# Patient Record
Sex: Male | Born: 1950 | Smoking: Former smoker
Health system: Southern US, Community
[De-identification: ages and names within clinical notes are randomized; demographics above are authoritative.]

---

## 2018-04-04 ENCOUNTER — Inpatient Hospital Stay (HOSPITAL_COMMUNITY): Payer: Medicare (Managed Care)

## 2018-04-04 ENCOUNTER — Inpatient Hospital Stay (HOSPITAL_COMMUNITY)
Admission: AD | Admit: 2018-04-04 | Discharge: 2018-04-15 | DRG: 870 | Disposition: E | Payer: Medicare (Managed Care) | Source: Other Acute Inpatient Hospital | Attending: Pulmonary Disease | Admitting: Pulmonary Disease

## 2018-04-04 DIAGNOSIS — Z7189 Other specified counseling: Secondary | ICD-10-CM

## 2018-04-04 DIAGNOSIS — I619 Nontraumatic intracerebral hemorrhage, unspecified: Secondary | ICD-10-CM | POA: Diagnosis not present

## 2018-04-04 DIAGNOSIS — N179 Acute kidney failure, unspecified: Secondary | ICD-10-CM

## 2018-04-04 DIAGNOSIS — Z87891 Personal history of nicotine dependence: Secondary | ICD-10-CM

## 2018-04-04 DIAGNOSIS — I130 Hypertensive heart and chronic kidney disease with heart failure and stage 1 through stage 4 chronic kidney disease, or unspecified chronic kidney disease: Secondary | ICD-10-CM | POA: Diagnosis present

## 2018-04-04 DIAGNOSIS — A419 Sepsis, unspecified organism: Principal | ICD-10-CM | POA: Diagnosis present

## 2018-04-04 DIAGNOSIS — Z66 Do not resuscitate: Secondary | ICD-10-CM | POA: Diagnosis present

## 2018-04-04 DIAGNOSIS — D500 Iron deficiency anemia secondary to blood loss (chronic): Secondary | ICD-10-CM

## 2018-04-04 DIAGNOSIS — I6310 Cerebral infarction due to embolism of unspecified precerebral artery: Secondary | ICD-10-CM | POA: Diagnosis not present

## 2018-04-04 DIAGNOSIS — F0390 Unspecified dementia without behavioral disturbance: Secondary | ICD-10-CM | POA: Diagnosis present

## 2018-04-04 DIAGNOSIS — E876 Hypokalemia: Secondary | ICD-10-CM | POA: Diagnosis not present

## 2018-04-04 DIAGNOSIS — I214 Non-ST elevation (NSTEMI) myocardial infarction: Secondary | ICD-10-CM | POA: Diagnosis present

## 2018-04-04 DIAGNOSIS — I63413 Cerebral infarction due to embolism of bilateral middle cerebral arteries: Secondary | ICD-10-CM | POA: Diagnosis not present

## 2018-04-04 DIAGNOSIS — G9340 Encephalopathy, unspecified: Secondary | ICD-10-CM | POA: Diagnosis present

## 2018-04-04 DIAGNOSIS — Z452 Encounter for adjustment and management of vascular access device: Secondary | ICD-10-CM

## 2018-04-04 DIAGNOSIS — Z8249 Family history of ischemic heart disease and other diseases of the circulatory system: Secondary | ICD-10-CM

## 2018-04-04 DIAGNOSIS — J150 Pneumonia due to Klebsiella pneumoniae: Secondary | ICD-10-CM | POA: Diagnosis present

## 2018-04-04 DIAGNOSIS — J9601 Acute respiratory failure with hypoxia: Secondary | ICD-10-CM | POA: Diagnosis not present

## 2018-04-04 DIAGNOSIS — E440 Moderate protein-calorie malnutrition: Secondary | ICD-10-CM | POA: Diagnosis not present

## 2018-04-04 DIAGNOSIS — E1122 Type 2 diabetes mellitus with diabetic chronic kidney disease: Secondary | ICD-10-CM | POA: Diagnosis present

## 2018-04-04 DIAGNOSIS — K219 Gastro-esophageal reflux disease without esophagitis: Secondary | ICD-10-CM | POA: Diagnosis present

## 2018-04-04 DIAGNOSIS — I6340 Cerebral infarction due to embolism of unspecified cerebral artery: Secondary | ICD-10-CM

## 2018-04-04 DIAGNOSIS — E874 Mixed disorder of acid-base balance: Secondary | ICD-10-CM | POA: Diagnosis present

## 2018-04-04 DIAGNOSIS — J9600 Acute respiratory failure, unspecified whether with hypoxia or hypercapnia: Secondary | ICD-10-CM | POA: Diagnosis not present

## 2018-04-04 DIAGNOSIS — N170 Acute kidney failure with tubular necrosis: Secondary | ICD-10-CM | POA: Diagnosis present

## 2018-04-04 DIAGNOSIS — R680 Hypothermia, not associated with low environmental temperature: Secondary | ICD-10-CM | POA: Diagnosis present

## 2018-04-04 DIAGNOSIS — I5032 Chronic diastolic (congestive) heart failure: Secondary | ICD-10-CM | POA: Diagnosis not present

## 2018-04-04 DIAGNOSIS — R7989 Other specified abnormal findings of blood chemistry: Secondary | ICD-10-CM | POA: Diagnosis not present

## 2018-04-04 DIAGNOSIS — J9690 Respiratory failure, unspecified, unspecified whether with hypoxia or hypercapnia: Secondary | ICD-10-CM

## 2018-04-04 DIAGNOSIS — K7200 Acute and subacute hepatic failure without coma: Secondary | ICD-10-CM | POA: Diagnosis present

## 2018-04-04 DIAGNOSIS — G935 Compression of brain: Secondary | ICD-10-CM | POA: Diagnosis present

## 2018-04-04 DIAGNOSIS — K922 Gastrointestinal hemorrhage, unspecified: Secondary | ICD-10-CM | POA: Diagnosis not present

## 2018-04-04 DIAGNOSIS — Z794 Long term (current) use of insulin: Secondary | ICD-10-CM

## 2018-04-04 DIAGNOSIS — M810 Age-related osteoporosis without current pathological fracture: Secondary | ICD-10-CM | POA: Diagnosis present

## 2018-04-04 DIAGNOSIS — E875 Hyperkalemia: Secondary | ICD-10-CM | POA: Diagnosis present

## 2018-04-04 DIAGNOSIS — E1165 Type 2 diabetes mellitus with hyperglycemia: Secondary | ICD-10-CM | POA: Diagnosis not present

## 2018-04-04 DIAGNOSIS — K921 Melena: Secondary | ICD-10-CM | POA: Diagnosis not present

## 2018-04-04 DIAGNOSIS — R945 Abnormal results of liver function studies: Secondary | ICD-10-CM

## 2018-04-04 DIAGNOSIS — N183 Chronic kidney disease, stage 3 (moderate): Secondary | ICD-10-CM | POA: Diagnosis present

## 2018-04-04 DIAGNOSIS — L89811 Pressure ulcer of head, stage 1: Secondary | ICD-10-CM | POA: Diagnosis not present

## 2018-04-04 DIAGNOSIS — E785 Hyperlipidemia, unspecified: Secondary | ICD-10-CM | POA: Diagnosis present

## 2018-04-04 DIAGNOSIS — Z833 Family history of diabetes mellitus: Secondary | ICD-10-CM

## 2018-04-04 DIAGNOSIS — Z515 Encounter for palliative care: Secondary | ICD-10-CM

## 2018-04-04 DIAGNOSIS — D72829 Elevated white blood cell count, unspecified: Secondary | ICD-10-CM | POA: Diagnosis not present

## 2018-04-04 DIAGNOSIS — Z1612 Extended spectrum beta lactamase (ESBL) resistance: Secondary | ICD-10-CM | POA: Diagnosis not present

## 2018-04-04 DIAGNOSIS — Z6825 Body mass index (BMI) 25.0-25.9, adult: Secondary | ICD-10-CM

## 2018-04-04 DIAGNOSIS — R509 Fever, unspecified: Secondary | ICD-10-CM | POA: Diagnosis not present

## 2018-04-04 DIAGNOSIS — Z79899 Other long term (current) drug therapy: Secondary | ICD-10-CM

## 2018-04-04 DIAGNOSIS — R6521 Severe sepsis with septic shock: Secondary | ICD-10-CM | POA: Diagnosis present

## 2018-04-04 DIAGNOSIS — Z7982 Long term (current) use of aspirin: Secondary | ICD-10-CM

## 2018-04-04 DIAGNOSIS — D696 Thrombocytopenia, unspecified: Secondary | ICD-10-CM | POA: Diagnosis not present

## 2018-04-04 DIAGNOSIS — L8990 Pressure ulcer of unspecified site, unspecified stage: Secondary | ICD-10-CM

## 2018-04-04 DIAGNOSIS — I959 Hypotension, unspecified: Secondary | ICD-10-CM | POA: Diagnosis not present

## 2018-04-04 DIAGNOSIS — Z8673 Personal history of transient ischemic attack (TIA), and cerebral infarction without residual deficits: Secondary | ICD-10-CM

## 2018-04-04 DIAGNOSIS — Z4659 Encounter for fitting and adjustment of other gastrointestinal appliance and device: Secondary | ICD-10-CM

## 2018-04-04 DIAGNOSIS — I82409 Acute embolism and thrombosis of unspecified deep veins of unspecified lower extremity: Secondary | ICD-10-CM | POA: Diagnosis not present

## 2018-04-04 LAB — POCT I-STAT 3, ART BLOOD GAS (G3+)
Acid-base deficit: 29 mmol/L — ABNORMAL HIGH (ref 0.0–2.0)
Acid-base deficit: 25 mmol/L — ABNORMAL HIGH (ref 0.0–2.0)
Acid-base deficit: 22 mmol/L — ABNORMAL HIGH (ref 0.0–2.0)
BICARBONATE: 4 mmol/L — AB (ref 20.0–28.0)
Bicarbonate: 5.1 mmol/L — ABNORMAL LOW (ref 20.0–28.0)
Bicarbonate: 8.7 mmol/L — ABNORMAL LOW (ref 20.0–28.0)
O2 Saturation: 98 %
O2 Saturation: 98 %
O2 Saturation: 98 %
PCO2 ART: 20.3 mmHg — AB (ref 32.0–48.0)
PO2 ART: 176 mmHg — AB (ref 83.0–108.0)
Patient temperature: 34.1
Patient temperature: 33.8
TCO2: <5 mmol/L — ABNORMAL LOW (ref 22–32)
TCO2: 6 mmol/L — ABNORMAL LOW (ref 22–32)
TCO2: 10 mmol/L — ABNORMAL LOW (ref 22–32)
pCO2 arterial: 20.4 mmHg — ABNORMAL LOW (ref 32.0–48.0)
pCO2 arterial: 32.2 mmHg (ref 32.0–48.0)
pH, Arterial: 6.875 — CL (ref 7.350–7.450)
pH, Arterial: 6.985 — CL (ref 7.350–7.450)
pH, Arterial: 7.017 — CL (ref 7.350–7.450)
pO2, Arterial: 156 mmHg — ABNORMAL HIGH (ref 83.0–108.0)
pO2, Arterial: 138 mmHg — ABNORMAL HIGH (ref 83.0–108.0)

## 2018-04-04 LAB — CBC WITH DIFFERENTIAL/PLATELET
Band Neutrophils: 0 %
Basophils Absolute: 0.3 10*3/uL — ABNORMAL HIGH (ref 0.0–0.1)
Basophils Relative: 1 %
Blasts: 0 %
Eosinophils Absolute: 0 10*3/uL (ref 0.0–0.5)
Eosinophils Relative: 0 %
HCT: 39.2 % (ref 39.0–52.0)
Hemoglobin: 11.2 g/dL — ABNORMAL LOW (ref 13.0–17.0)
Lymphocytes Relative: 10 %
Lymphs Abs: 2.8 10*3/uL (ref 0.7–4.0)
MCH: 32.6 pg (ref 26.0–34.0)
MCHC: 28.6 g/dL — ABNORMAL LOW (ref 30.0–36.0)
MCV: 114 fL — ABNORMAL HIGH (ref 80.0–100.0)
MONO ABS: 3 10*3/uL — AB (ref 0.1–1.0)
Metamyelocytes Relative: 0 %
Monocytes Relative: 11 %
Myelocytes: 0 %
Neutro Abs: 21.5 10*3/uL — ABNORMAL HIGH (ref 1.7–7.7)
Neutrophils Relative %: 78 %
Other: 0 %
PLATELETS: 273 10*3/uL (ref 150–400)
Promyelocytes Relative: 0 %
RBC: 3.44 MIL/uL — ABNORMAL LOW (ref 4.22–5.81)
RDW: 13.2 % (ref 11.5–15.5)
WBC: 27.6 10*3/uL — ABNORMAL HIGH (ref 4.0–10.5)
nRBC: 0 % (ref 0.0–0.2)
nRBC: 0 /100 WBC

## 2018-04-04 LAB — TROPONIN I
TROPONIN I: 1.06 ng/mL — AB (ref <0.03)
TROPONIN I: 9.41 ng/mL — AB (ref <0.03)
Troponin I: 1.5 ng/mL (ref <0.03)
Troponin I: 4.78 ng/mL (ref <0.03)

## 2018-04-04 LAB — COMPREHENSIVE METABOLIC PANEL
ALT: 123 U/L — ABNORMAL HIGH (ref 0–44)
ALT: 118 U/L — ABNORMAL HIGH (ref 0–44)
AST: 258 U/L — ABNORMAL HIGH (ref 15–41)
AST: 232 U/L — ABNORMAL HIGH (ref 15–41)
Albumin: 2.2 g/dL — ABNORMAL LOW (ref 3.5–5.0)
Albumin: 2.2 g/dL — ABNORMAL LOW (ref 3.5–5.0)
Alkaline Phosphatase: 129 U/L — ABNORMAL HIGH (ref 38–126)
Alkaline Phosphatase: 124 U/L (ref 38–126)
Anion gap: 33 — ABNORMAL HIGH (ref 5–15)
BUN: 103 mg/dL — ABNORMAL HIGH (ref 8–23)
BUN: 88 mg/dL — ABNORMAL HIGH (ref 8–23)
CO2: <7 mmol/L — ABNORMAL LOW (ref 22–32)
CO2: 10 mmol/L — ABNORMAL LOW (ref 22–32)
Calcium: 7.6 mg/dL — ABNORMAL LOW (ref 8.9–10.3)
Calcium: 6.5 mg/dL — ABNORMAL LOW (ref 8.9–10.3)
Chloride: 108 mmol/L (ref 98–111)
Chloride: 102 mmol/L (ref 98–111)
Creatinine, Ser: 7.68 mg/dL — ABNORMAL HIGH (ref 0.61–1.24)
Creatinine, Ser: 6.22 mg/dL — ABNORMAL HIGH (ref 0.61–1.24)
GFR calc Af Amer: 10 mL/min — ABNORMAL LOW (ref >60)
GFR calc non Af Amer: 8 mL/min — ABNORMAL LOW (ref >60)
GFR, EST AFRICAN AMERICAN: 8 mL/min — AB (ref >60)
GFR, EST NON AFRICAN AMERICAN: 7 mL/min — AB (ref >60)
Glucose, Bld: 252 mg/dL — ABNORMAL HIGH (ref 70–99)
Glucose, Bld: 280 mg/dL — ABNORMAL HIGH (ref 70–99)
Potassium: 7.3 mmol/L (ref 3.5–5.1)
Potassium: 6.1 mmol/L — ABNORMAL HIGH (ref 3.5–5.1)
Sodium: 144 mmol/L (ref 135–145)
Sodium: 145 mmol/L (ref 135–145)
TOTAL PROTEIN: 4.8 g/dL — AB (ref 6.5–8.1)
Total Bilirubin: 1.8 mg/dL — ABNORMAL HIGH (ref 0.3–1.2)
Total Bilirubin: 2.3 mg/dL — ABNORMAL HIGH (ref 0.3–1.2)
Total Protein: 4.4 g/dL — ABNORMAL LOW (ref 6.5–8.1)

## 2018-04-04 LAB — VOLATILES,BLD-ACETONE,ETHANOL,ISOPROP,METHANOL
Acetone, blood: 0.016 % — ABNORMAL HIGH (ref 0.000–0.010)
Ethanol, blood: NEGATIVE % (ref 0.000–0.010)
Isopropanol, blood: NEGATIVE % (ref 0.000–0.010)
Methanol, blood: NEGATIVE % (ref 0.000–0.010)

## 2018-04-04 LAB — RENAL FUNCTION PANEL
Albumin: 2.1 g/dL — ABNORMAL LOW (ref 3.5–5.0)
Anion gap: 30 — ABNORMAL HIGH (ref 5–15)
BUN: 69 mg/dL — ABNORMAL HIGH (ref 8–23)
CO2: 16 mmol/L — ABNORMAL LOW (ref 22–32)
Calcium: 6 mg/dL — CL (ref 8.9–10.3)
Chloride: 96 mmol/L — ABNORMAL LOW (ref 98–111)
Creatinine, Ser: 4.9 mg/dL — ABNORMAL HIGH (ref 0.61–1.24)
GFR calc Af Amer: 13 mL/min — ABNORMAL LOW (ref >60)
GFR calc non Af Amer: 11 mL/min — ABNORMAL LOW (ref >60)
Glucose, Bld: 124 mg/dL — ABNORMAL HIGH (ref 70–99)
Phosphorus: 5.3 mg/dL — ABNORMAL HIGH (ref 2.5–4.6)
Potassium: 4.9 mmol/L (ref 3.5–5.1)
Sodium: 142 mmol/L (ref 135–145)

## 2018-04-04 LAB — POCT I-STAT, CHEM 8
BUN: 81 mg/dL — ABNORMAL HIGH (ref 8–23)
CHLORIDE: 105 mmol/L (ref 98–111)
CREATININE: 5.4 mg/dL — AB (ref 0.61–1.24)
Calcium, Ion: 0.79 mmol/L — CL (ref 1.15–1.40)
Glucose, Bld: 220 mg/dL — ABNORMAL HIGH (ref 70–99)
HCT: 30 % — ABNORMAL LOW (ref 39.0–52.0)
Hemoglobin: 10.2 g/dL — ABNORMAL LOW (ref 13.0–17.0)
Potassium: 5.5 mmol/L — ABNORMAL HIGH (ref 3.5–5.1)
Sodium: 141 mmol/L (ref 135–145)
TCO2: 13 mmol/L — ABNORMAL LOW (ref 22–32)

## 2018-04-04 LAB — LACTIC ACID, PLASMA
LACTIC ACID, VENOUS: 11.4 mmol/L — AB (ref 0.5–1.9)
Lactic Acid, Venous: 12.1 mmol/L (ref 0.5–1.9)
Lactic Acid, Venous: 9 mmol/L (ref 0.5–1.9)
Lactic Acid, Venous: 7.6 mmol/L (ref 0.5–1.9)

## 2018-04-04 LAB — POCT ACTIVATED CLOTTING TIME
ACTIVATED CLOTTING TIME: 158 s
Activated Clotting Time: 147 seconds
Activated Clotting Time: 175 seconds
Activated Clotting Time: 175 seconds
Activated Clotting Time: 219 seconds
Activated Clotting Time: 208 seconds
Activated Clotting Time: 208 seconds
Activated Clotting Time: 213 seconds
Activated Clotting Time: 219 seconds
Activated Clotting Time: 235 seconds
Activated Clotting Time: 246 seconds
Activated Clotting Time: 252 seconds
Activated Clotting Time: 257 seconds
Activated Clotting Time: 268 seconds
Activated Clotting Time: 246 seconds

## 2018-04-04 LAB — CBC
HCT: 38.8 % — ABNORMAL LOW (ref 39.0–52.0)
HCT: 34.6 % — ABNORMAL LOW (ref 39.0–52.0)
Hemoglobin: 11.3 g/dL — ABNORMAL LOW (ref 13.0–17.0)
Hemoglobin: 10.7 g/dL — ABNORMAL LOW (ref 13.0–17.0)
MCH: 32.6 pg (ref 26.0–34.0)
MCH: 32.2 pg (ref 26.0–34.0)
MCHC: 29.1 g/dL — ABNORMAL LOW (ref 30.0–36.0)
MCHC: 30.9 g/dL (ref 30.0–36.0)
MCV: 111.8 fL — ABNORMAL HIGH (ref 80.0–100.0)
MCV: 104.2 fL — ABNORMAL HIGH (ref 80.0–100.0)
Platelets: 198 10*3/uL (ref 150–400)
Platelets: 160 10*3/uL (ref 150–400)
RBC: 3.47 MIL/uL — AB (ref 4.22–5.81)
RBC: 3.32 MIL/uL — AB (ref 4.22–5.81)
RDW: 13.1 % (ref 11.5–15.5)
RDW: 13.2 % (ref 11.5–15.5)
WBC: 21 10*3/uL — ABNORMAL HIGH (ref 4.0–10.5)
WBC: 25.7 10*3/uL — ABNORMAL HIGH (ref 4.0–10.5)
nRBC: 0.1 % (ref 0.0–0.2)
nRBC: 0 % (ref 0.0–0.2)

## 2018-04-04 LAB — URINALYSIS, ROUTINE W REFLEX MICROSCOPIC
GLUCOSE, UA: NEGATIVE mg/dL
Ketones, ur: 15 mg/dL — AB
Leukocytes, UA: NEGATIVE
Nitrite: NEGATIVE
Protein, ur: >300 mg/dL — AB
Specific Gravity, Urine: >1.03 — ABNORMAL HIGH (ref 1.005–1.030)
pH: 5 (ref 5.0–8.0)

## 2018-04-04 LAB — PHOSPHORUS
Phosphorus: 13.6 mg/dL — ABNORMAL HIGH (ref 2.5–4.6)
Phosphorus: 12.8 mg/dL — ABNORMAL HIGH (ref 2.5–4.6)
Phosphorus: 11.2 mg/dL — ABNORMAL HIGH (ref 2.5–4.6)

## 2018-04-04 LAB — PROTIME-INR
INR: 1.48
INR: 1.4
Prothrombin Time: 17.7 seconds — ABNORMAL HIGH (ref 11.4–15.2)
Prothrombin Time: 17 seconds — ABNORMAL HIGH (ref 11.4–15.2)

## 2018-04-04 LAB — BASIC METABOLIC PANEL
BUN: 98 mg/dL — ABNORMAL HIGH (ref 8–23)
CO2: <7 mmol/L — ABNORMAL LOW (ref 22–32)
Calcium: 7.2 mg/dL — ABNORMAL LOW (ref 8.9–10.3)
Chloride: 108 mmol/L (ref 98–111)
Creatinine, Ser: 7.15 mg/dL — ABNORMAL HIGH (ref 0.61–1.24)
GFR calc Af Amer: 8 mL/min — ABNORMAL LOW (ref >60)
GFR calc non Af Amer: 7 mL/min — ABNORMAL LOW (ref >60)
GLUCOSE: 268 mg/dL — AB (ref 70–99)
Potassium: 7 mmol/L (ref 3.5–5.1)
Sodium: 147 mmol/L — ABNORMAL HIGH (ref 135–145)

## 2018-04-04 LAB — HEPARIN LEVEL (UNFRACTIONATED): Heparin Unfractionated: 1.68 IU/mL — ABNORMAL HIGH (ref 0.30–0.70)

## 2018-04-04 LAB — GLUCOSE, CAPILLARY
GLUCOSE-CAPILLARY: 247 mg/dL — AB (ref 70–99)
Glucose-Capillary: 216 mg/dL — ABNORMAL HIGH (ref 70–99)
Glucose-Capillary: 222 mg/dL — ABNORMAL HIGH (ref 70–99)
Glucose-Capillary: 217 mg/dL — ABNORMAL HIGH (ref 70–99)
Glucose-Capillary: 161 mg/dL — ABNORMAL HIGH (ref 70–99)
Glucose-Capillary: 110 mg/dL — ABNORMAL HIGH (ref 70–99)
Glucose-Capillary: 124 mg/dL — ABNORMAL HIGH (ref 70–99)

## 2018-04-04 LAB — APTT
aPTT: 29 seconds (ref 24–36)
aPTT: 64 seconds — ABNORMAL HIGH (ref 24–36)

## 2018-04-04 LAB — MAGNESIUM
Magnesium: 2.2 mg/dL (ref 1.7–2.4)
Magnesium: 2.1 mg/dL (ref 1.7–2.4)

## 2018-04-04 LAB — ABO/RH: ABO/RH(D): O POS

## 2018-04-04 LAB — CORTISOL: Cortisol, Plasma: >100 ug/dL

## 2018-04-04 LAB — URINALYSIS, MICROSCOPIC (REFLEX): RBC / HPF: >50 RBC/hpf (ref 0–5)

## 2018-04-04 LAB — COOXEMETRY PANEL
CARBOXYHEMOGLOBIN: 1 % (ref 0.5–1.5)
Methemoglobin: 2.2 % — ABNORMAL HIGH (ref 0.0–1.5)
O2 Saturation: 85 %
Total hemoglobin: 6.2 g/dL — CL (ref 12.0–16.0)

## 2018-04-04 LAB — AMMONIA
Ammonia: 63 umol/L — ABNORMAL HIGH (ref 9–35)
Ammonia: 29 umol/L (ref 9–35)

## 2018-04-04 LAB — BETA-HYDROXYBUTYRIC ACID: Beta-Hydroxybutyric Acid: 7.38 mmol/L — ABNORMAL HIGH (ref 0.05–0.27)

## 2018-04-04 LAB — MRSA PCR SCREENING: MRSA by PCR: NEGATIVE

## 2018-04-04 MED ORDER — SODIUM BICARBONATE 8.4 % IV SOLN
INTRAVENOUS | Status: AC
  Administered 2018-04-04: 50 meq
  Filled 2018-04-04: qty 50

## 2018-04-04 MED ORDER — BISACODYL 10 MG RE SUPP: 10.0000 mg | Freq: Every day | RECTAL | Status: DC | PRN

## 2018-04-04 MED ORDER — NOREPINEPHRINE 16 MG/250ML-% IV SOLN
0.0000 ug/min | INTRAVENOUS | Status: DC
  Administered 2018-04-04: 40 ug/min via INTRAVENOUS
  Administered 2018-04-04: 55 ug/min via INTRAVENOUS
  Administered 2018-04-04: 40 ug/min via INTRAVENOUS
  Administered 2018-04-05: 26 ug/min via INTRAVENOUS
  Filled 2018-04-04 (×5): qty 250

## 2018-04-04 MED ORDER — CHLORHEXIDINE GLUCONATE 0.12% ORAL RINSE (MEDLINE KIT)
15.0000 mL | Freq: Two times a day (BID) | OROMUCOSAL | Status: DC
  Administered 2018-04-04 – 2018-04-13 (×18): 15 mL via OROMUCOSAL

## 2018-04-04 MED ORDER — HEPARIN SODIUM (PORCINE) 1000 UNIT/ML DIALYSIS: 1000.0000 [IU] | INTRAMUSCULAR | Status: DC | PRN

## 2018-04-04 MED ORDER — SODIUM CHLORIDE 0.9 % IV SOLN
250.0000 [IU]/h | INTRAVENOUS | Status: DC
  Administered 2018-04-04: 700 [IU]/h via INTRAVENOUS_CENTRAL
  Administered 2018-04-04: 500 [IU]/h via INTRAVENOUS_CENTRAL
  Administered 2018-04-04: 800 [IU]/h via INTRAVENOUS_CENTRAL
  Administered 2018-04-04: 900 [IU]/h via INTRAVENOUS_CENTRAL
  Administered 2018-04-05: 250 [IU]/h via INTRAVENOUS_CENTRAL
  Administered 2018-04-06: 1000 [IU]/h via INTRAVENOUS_CENTRAL
  Administered 2018-04-06: 850 [IU]/h via INTRAVENOUS_CENTRAL
  Administered 2018-04-06: 1000 [IU]/h via INTRAVENOUS_CENTRAL
  Administered 2018-04-06: 850 [IU]/h via INTRAVENOUS_CENTRAL
  Administered 2018-04-07 – 2018-04-08 (×5): 1000 [IU]/h via INTRAVENOUS_CENTRAL
  Filled 2018-04-04 (×16): qty 2

## 2018-04-04 MED ORDER — FAMOTIDINE 20 MG PO TABS
20.0000 mg | ORAL_TABLET | Freq: Two times a day (BID) | ORAL | Status: DC
  Administered 2018-04-04 (×2): 20 mg
  Filled 2018-04-04 (×2): qty 1

## 2018-04-04 MED ORDER — SODIUM BICARBONATE 8.4 % IV SOLN
100.0000 meq | Freq: Once | INTRAVENOUS | Status: AC
  Administered 2018-04-04: 100 meq via INTRAVENOUS
  Filled 2018-04-04: qty 100

## 2018-04-04 MED ORDER — FENTANYL CITRATE (PF) 100 MCG/2ML IJ SOLN
50.0000 ug | INTRAMUSCULAR | Status: DC | PRN
  Administered 2018-04-04 – 2018-04-05 (×2): 50 ug via INTRAVENOUS
  Filled 2018-04-04: qty 2

## 2018-04-04 MED ORDER — NOREPINEPHRINE BITARTRATE 1 MG/ML IV SOLN
5.0000 ug/min | INTRAVENOUS | Status: DC
  Filled 2018-04-04: qty 4

## 2018-04-04 MED ORDER — STERILE WATER FOR INJECTION IV SOLN
INTRAVENOUS | Status: DC
  Administered 2018-04-04 – 2018-04-05 (×9): via INTRAVENOUS_CENTRAL
  Filled 2018-04-04 (×14): qty 150

## 2018-04-04 MED ORDER — HEPARIN (PORCINE) 2000 UNITS/L FOR CRRT
INTRAVENOUS_CENTRAL | Status: DC | PRN
  Administered 2018-04-04: 05:00:00 via INTRAVENOUS_CENTRAL

## 2018-04-04 MED ORDER — GENERIC EXTERNAL MEDICATION
5.00 | Status: DC
Start: ? — End: 2018-04-04

## 2018-04-04 MED ORDER — HYDROCORTISONE NA SUCCINATE PF 100 MG IJ SOLR
50.0000 mg | Freq: Four times a day (QID) | INTRAMUSCULAR | Status: DC
  Administered 2018-04-04 – 2018-04-08 (×17): 50 mg via INTRAVENOUS
  Filled 2018-04-04 (×17): qty 2

## 2018-04-04 MED ORDER — INSULIN ASPART 100 UNIT/ML ~~LOC~~ SOLN
0.0000 [IU] | SUBCUTANEOUS | Status: DC
  Administered 2018-04-04: 3 [IU] via SUBCUTANEOUS
  Administered 2018-04-04: 5 [IU] via SUBCUTANEOUS
  Administered 2018-04-04 – 2018-04-05 (×5): 2 [IU] via SUBCUTANEOUS
  Administered 2018-04-06: 3 [IU] via SUBCUTANEOUS
  Administered 2018-04-06: 2 [IU] via SUBCUTANEOUS
  Administered 2018-04-06 – 2018-04-07 (×4): 3 [IU] via SUBCUTANEOUS
  Administered 2018-04-07: 2 [IU] via SUBCUTANEOUS
  Administered 2018-04-07: 3 [IU] via SUBCUTANEOUS
  Administered 2018-04-08: 8 [IU] via SUBCUTANEOUS
  Administered 2018-04-08: 3 [IU] via SUBCUTANEOUS
  Administered 2018-04-08: 8 [IU] via SUBCUTANEOUS
  Administered 2018-04-08: 5 [IU] via SUBCUTANEOUS
  Administered 2018-04-08: 3 [IU] via SUBCUTANEOUS
  Administered 2018-04-08: 5 [IU] via SUBCUTANEOUS
  Administered 2018-04-09: 2 [IU] via SUBCUTANEOUS
  Administered 2018-04-09 (×2): 5 [IU] via SUBCUTANEOUS
  Administered 2018-04-09: 11 [IU] via SUBCUTANEOUS
  Administered 2018-04-09 (×2): 8 [IU] via SUBCUTANEOUS
  Administered 2018-04-10 (×2): 5 [IU] via SUBCUTANEOUS
  Administered 2018-04-10 (×2): 8 [IU] via SUBCUTANEOUS
  Administered 2018-04-10: 2 [IU] via SUBCUTANEOUS
  Administered 2018-04-10: 3 [IU] via SUBCUTANEOUS
  Administered 2018-04-10 – 2018-04-11 (×3): 5 [IU] via SUBCUTANEOUS
  Administered 2018-04-11 (×2): 8 [IU] via SUBCUTANEOUS
  Administered 2018-04-11: 5 [IU] via SUBCUTANEOUS
  Administered 2018-04-12: 2 [IU] via SUBCUTANEOUS
  Administered 2018-04-12 (×2): 3 [IU] via SUBCUTANEOUS
  Administered 2018-04-12: 2 [IU] via SUBCUTANEOUS
  Administered 2018-04-12 (×2): 3 [IU] via SUBCUTANEOUS
  Administered 2018-04-13: 5 [IU] via SUBCUTANEOUS
  Administered 2018-04-13 (×3): 3 [IU] via SUBCUTANEOUS

## 2018-04-04 MED ORDER — ATORVASTATIN CALCIUM 80 MG PO TABS
80.0000 mg | ORAL_TABLET | Freq: Every day | ORAL | Status: DC
  Administered 2018-04-06 – 2018-04-11 (×5): 80 mg
  Filled 2018-04-04 (×7): qty 1

## 2018-04-04 MED ORDER — FENTANYL CITRATE (PF) 100 MCG/2ML IJ SOLN
50.0000 ug | INTRAMUSCULAR | Status: DC | PRN
  Filled 2018-04-04: qty 2

## 2018-04-04 MED ORDER — SODIUM CHLORIDE 0.9% IV SOLUTION: Freq: Once | INTRAVENOUS | Status: DC

## 2018-04-04 MED ORDER — CHLORHEXIDINE GLUCONATE CLOTH 2 % EX PADS
6.0000 | MEDICATED_PAD | Freq: Every day | CUTANEOUS | Status: DC
  Administered 2018-04-04 – 2018-04-12 (×9): 6 via TOPICAL

## 2018-04-04 MED ORDER — PHENYLEPHRINE HCL-NACL 10-0.9 MG/250ML-% IV SOLN
25.0000 ug/min | INTRAVENOUS | Status: DC
  Administered 2018-04-04: 150 ug/min via INTRAVENOUS

## 2018-04-04 MED ORDER — SODIUM CHLORIDE 0.9 % IV SOLN
2.0000 g | Freq: Two times a day (BID) | INTRAVENOUS | Status: DC
  Administered 2018-04-04 – 2018-04-07 (×7): 2 g via INTRAVENOUS
  Filled 2018-04-04 (×7): qty 2

## 2018-04-04 MED ORDER — DONEPEZIL HCL 10 MG PO TABS
10.0000 mg | ORAL_TABLET | Freq: Every day | ORAL | Status: DC
  Administered 2018-04-04 – 2018-04-12 (×9): 10 mg via ORAL
  Filled 2018-04-04 (×9): qty 1

## 2018-04-04 MED ORDER — HEPARIN BOLUS VIA INFUSION
4000.0000 [IU] | Freq: Once | INTRAVENOUS | Status: AC
  Administered 2018-04-04: 4000 [IU] via INTRAVENOUS
  Filled 2018-04-04: qty 4000

## 2018-04-04 MED ORDER — SODIUM CHLORIDE 0.9 % IV SOLN
25.0000 ug/min | INTRAVENOUS | Status: DC
  Administered 2018-04-04: 53.333 ug/min via INTRAVENOUS
  Administered 2018-04-04: 350 ug/min via INTRAVENOUS
  Administered 2018-04-04 (×2): 300 ug/min via INTRAVENOUS
  Administered 2018-04-04 (×5): 400 ug/min via INTRAVENOUS
  Administered 2018-04-04: 125 ug/min via INTRAVENOUS
  Administered 2018-04-05: 120 ug/min via INTRAVENOUS
  Administered 2018-04-05 (×2): 300 ug/min via INTRAVENOUS
  Filled 2018-04-04 (×4): qty 40
  Filled 2018-04-04: qty 4
  Filled 2018-04-04: qty 40
  Filled 2018-04-04: qty 4
  Filled 2018-04-04 (×3): qty 40
  Filled 2018-04-04 (×3): qty 4
  Filled 2018-04-04 (×2): qty 40

## 2018-04-04 MED ORDER — SODIUM CHLORIDE 0.9 % IV SOLN
250.0000 [IU]/h | INTRAVENOUS | Status: DC
  Filled 2018-04-04: qty 2

## 2018-04-04 MED ORDER — PRISMASOL BGK 4/2.5 32-4-2.5 MEQ/L IV SOLN
INTRAVENOUS | Status: DC
  Administered 2018-04-04 – 2018-04-07 (×26): via INTRAVENOUS_CENTRAL
  Filled 2018-04-04 (×34): qty 5000

## 2018-04-04 MED ORDER — CALCIUM GLUCONATE-NACL 2-0.675 GM/100ML-% IV SOLN
2.0000 g | Freq: Once | INTRAVENOUS | Status: AC
  Administered 2018-04-04: 2000 mg via INTRAVENOUS
  Filled 2018-04-04: qty 100

## 2018-04-04 MED ORDER — ASPIRIN 81 MG PO CHEW
81.0000 mg | CHEWABLE_TABLET | Freq: Every day | ORAL | Status: DC
  Administered 2018-04-04 – 2018-04-13 (×9): 81 mg
  Filled 2018-04-04 (×9): qty 1

## 2018-04-04 MED ORDER — DOCUSATE SODIUM 50 MG/5ML PO LIQD
100.0000 mg | Freq: Two times a day (BID) | ORAL | Status: DC | PRN
  Administered 2018-04-08: 100 mg
  Filled 2018-04-04: qty 10

## 2018-04-04 MED ORDER — HEPARIN BOLUS VIA INFUSION (CRRT)
1000.0000 [IU] | INTRAVENOUS | Status: DC | PRN
  Administered 2018-04-04 – 2018-04-06 (×2): 1000 [IU] via INTRAVENOUS_CENTRAL
  Filled 2018-04-04: qty 1000

## 2018-04-04 MED ORDER — ORAL CARE MOUTH RINSE
15.0000 mL | OROMUCOSAL | Status: DC
  Administered 2018-04-04 – 2018-04-13 (×86): 15 mL via OROMUCOSAL

## 2018-04-04 MED ORDER — GENERIC EXTERNAL MEDICATION
0.00 | Status: DC
Start: ? — End: 2018-04-04

## 2018-04-04 MED ORDER — SODIUM CHLORIDE 0.9% FLUSH: 10.0000 mL | INTRAVENOUS | Status: DC | PRN

## 2018-04-04 MED ORDER — STERILE WATER FOR INJECTION IV SOLN
INTRAVENOUS | Status: DC
  Administered 2018-04-04 – 2018-04-05 (×9): via INTRAVENOUS_CENTRAL
  Filled 2018-04-04 (×13): qty 150

## 2018-04-04 MED ORDER — HEPARIN (PORCINE) 25000 UT/250ML-% IV SOLN
850.0000 [IU]/h | INTRAVENOUS | Status: DC
  Administered 2018-04-04: 1000 [IU]/h via INTRAVENOUS
  Filled 2018-04-04: qty 250

## 2018-04-04 MED ORDER — SODIUM CHLORIDE 0.9% FLUSH
10.0000 mL | Freq: Two times a day (BID) | INTRAVENOUS | Status: DC
  Administered 2018-04-04 – 2018-04-06 (×6): 10 mL
  Administered 2018-04-07: 20 mL
  Administered 2018-04-07 – 2018-04-11 (×9): 10 mL
  Administered 2018-04-12: 20 mL
  Administered 2018-04-12 – 2018-04-13 (×2): 10 mL

## 2018-04-04 MED ORDER — SODIUM CHLORIDE 0.9 % IV SOLN
INTRAVENOUS | Status: AC
  Administered 2018-04-04 (×2): via INTRAVENOUS

## 2018-04-04 MED ORDER — SODIUM CHLORIDE 0.9 % FOR CRRT
INTRAVENOUS_CENTRAL | Status: DC | PRN
  Administered 2018-04-04: 03:00:00 via INTRAVENOUS_CENTRAL
  Filled 2018-04-04: qty 1000

## 2018-04-04 MED ORDER — PHENYLEPHRINE HCL-NACL 10-0.9 MG/250ML-% IV SOLN
INTRAVENOUS | Status: AC
  Filled 2018-04-04: qty 250

## 2018-04-04 MED ORDER — SODIUM BICARBONATE 8.4 % IV SOLN: 50.0000 meq | Freq: Once | INTRAVENOUS | Status: AC

## 2018-04-04 MED ORDER — SODIUM CHLORIDE 0.45 % IV SOLN
INTRAVENOUS | Status: AC
  Filled 2018-04-04: qty 100

## 2018-04-04 MED ORDER — VANCOMYCIN HCL IN DEXTROSE 750-5 MG/150ML-% IV SOLN
750.0000 mg | INTRAVENOUS | Status: DC
  Administered 2018-04-04 – 2018-04-07 (×4): 750 mg via INTRAVENOUS
  Filled 2018-04-04 (×4): qty 150

## 2018-04-04 MED ORDER — GENERIC EXTERNAL MEDICATION
Status: DC
Start: ? — End: 2018-04-04

## 2018-04-04 MED ORDER — HEPARIN SODIUM (PORCINE) 5000 UNIT/ML IJ SOLN: 5000.0000 [IU] | Freq: Three times a day (TID) | INTRAMUSCULAR | Status: DC

## 2018-04-04 MED ORDER — INSULIN ASPART 100 UNIT/ML ~~LOC~~ SOLN
1.0000 [IU] | SUBCUTANEOUS | Status: DC
  Administered 2018-04-04: 3 [IU] via SUBCUTANEOUS

## 2018-04-04 MED ORDER — HEPARIN SODIUM (PORCINE) 1000 UNIT/ML DIALYSIS
1000.0000 [IU] | INTRAMUSCULAR | Status: DC | PRN
  Administered 2018-04-09: 2.4 [IU] via INTRAVENOUS_CENTRAL
  Filled 2018-04-04: qty 1
  Filled 2018-04-04: qty 2
  Filled 2018-04-04 (×2): qty 6

## 2018-04-04 MED ORDER — SODIUM CHLORIDE 0.9 % IV SOLN
250.0000 mL | INTRAVENOUS | Status: DC
  Administered 2018-04-04 (×2): 250 mL via INTRAVENOUS
  Administered 2018-04-09: 500 mL via INTRAVENOUS
  Administered 2018-04-10: 250 mL via INTRAVENOUS

## 2018-04-04 MED ORDER — SODIUM CHLORIDE 0.9 % IV SOLN: INTRAVENOUS | Status: DC | PRN

## 2018-04-04 MED ORDER — VASOPRESSIN 20 UNIT/ML IV SOLN
0.0300 [IU]/min | INTRAVENOUS | Status: DC
  Administered 2018-04-04 – 2018-04-05 (×3): 0.03 [IU]/min via INTRAVENOUS
  Filled 2018-04-04 (×4): qty 2

## 2018-04-04 NOTE — Progress Notes (Signed)
CRITICAL VALUE ALERT  Critical Value:  K-7.3  Date & Time Notied:  04/07/2018.@ 0245  Provider Notified: Nehemiah SettleBrooke, NP and Dr. Kathrene BongoGoldsborough  Orders Received/Actions taken: CRRT previously ordered

## 2018-04-04 NOTE — Progress Notes (Signed)
NAME:  Raelene Bottddie Knapper, MRN:  [id number], DOB:  08-21-1950, LOS: 0 ADMISSION DATE:  03/27/2018, CHIEF COMPLAINT:  Altered mental status  History of present illness   67 year old man, former smoker, with dementia, CVA, T2DM, history of respiratory failure 2/2 pneumonia with previous trach (decannulated now), GERD presenting with altered mental status. Lives at [facility]. Sister noted that his mental status had changed for the last week - less conversant and interactive. Acute worsening of his mental status today. He had an episode of emesis this morning while eating. Otherwise no noted changes in symptoms. No indications of chest or abdominal pain. History obtained from sister since patient is acutely encephalopathic. At baseline he is conversant and oriented x 2 (gets confused about month).  At [facility], received 2L NS bolus and started on Levo gtt. He was also given vancomycin and cefepime. Also received 1 amp of bicarb, 1g calcium gluconate, insulin and started on a bicarb gtt. He was started on neosynephrine gtt en route.   Past Medical History  former smoker, with dementia, history CVA, T2DM, history of respiratory failure 2/2 pneumonia with previous trach (decannulated now), GERD, osteoporosis  Significant Hospital Events   12/21 admission, vas cath placement and CRRT initiated  Consults:  Nephrology  Procedures:  12/21 vas cath placement  Significant Diagnostic Tests:  CT head w/o contrast on 12/20: No CT evidence for acute intracranial abnormality. Atrophy and mild small vessel ischemic changes of the white matter  CXR 12/21: ETT and OGT in place, no acute cardiopulm disease, aortic atherosclerosis  Micro Data:  BCx 12/21 pending Resp Cx 12/21 pending  Antimicrobials:  Vanc 12/21> Cefepime 12/21>  Interim history/subjective:  Continues to be intubated, on pressors-levo, neo, vasopressin\  Objective   Blood pressure (!) 101/47, pulse (!) 119, temperature (!) 93  F (33.9 C), resp. rate (!) 30, height 5\' 2"  (1.575 m), weight 75 kg, SpO2 99 %.    Vent Mode: PRVC FiO2 (%):  [35 %] 35 % Set Rate:  [24 bmp-30 bmp] 30 bmp Vt Set:  [400 mL-600 mL] 400 mL PEEP:  [5 cmH20] 5 cmH20 Plateau Pressure:  [12 cmH20-15 cmH20] 12 cmH20   Intake/Output Summary (Last 24 hours) at 03/18/2018 0746 Last data filed at 04/01/2018 0700 Gross per 24 hour  Intake 1622.15 ml  Output 1130 ml  Net 492.15 ml   Filed Weights   04/10/2018 0055 03/30/2018 0500  Weight: 75.1 kg 75 kg    Examination: Blood pressure (!) 101/47, pulse (!) 119, temperature (!) 93 F (33.9 C), resp. rate (!) 30, height 5\' 2"  (1.575 m), weight 75 kg, SpO2 99 %. Gen:      No acute distress HEENT:  EOMI, sclera anicteric, ET tube Neck:     No masses; no thyromegaly Lungs:    Clear to auscultation bilaterally; normal respiratory effort CV:         Regular rate and rhythm; no murmurs Abd:      + bowel sounds; soft, non-tender; no palpable masses, no distension Ext:    No edema; adequate peripheral perfusion Skin:      Warm and dry; no rash Neuro: Sedated, unresponsive.   Assessment & Plan:  67 year old man, former smoker, with dementia, CVA, T2DM, history of respiratory failure 2/2 pneumonia with previous trach (decannulated now), GERD presenting with altered mental status.   Multiorgan failure Probable septic shock with lactic acidosis-unclear source of infection Continue pressors. Continue Vanco, cefepime. Recheck lactic acid, follow cultures Check cortisol, start  stress dose steroids.  Acute kidney injury with hyperkalemia and metabolic acidosis with increased anion gap:  Started on CRRT. Bicarb via CRRT  Diabetes, hyperglycemia Check beta hydroxybutyrate SSI coverage.  Change to moderate scale.  Elevated troponins Trend troponin levels Bedside echocardiogram shows no tamponade.  RV is dilated raising the possibility of pulmonary embolism Start empiric heparin, check lower  extremity Dopplers. Follow official echocardiogram Continue aspirin, statin  Elevated LFTs, ammonia Likely from shock liver.  Follow labs. Right upper quadrant ultrasound   Best practice:  Diet: NPO Pain/Anxiety/Delirium protocol (if indicated): fent prn VAP protocol (if indicated): ordered DVT prophylaxis: subq heparin GI prophylaxis: pepcid per tube  Glucose control: SSI Mobility: PT as able Code Status: DNR confirmed with sister.  Family Communication: Sister is healthcare POA. Updated her and brother in law at bedside.  Disposition: MICU  The patient is critically ill with multiple organ system failure and requires high complexity decision making for assessment and support, frequent evaluation and titration of therapies, advanced monitoring, review of radiographic studies and interpretation of complex data.   Critical Care Time devoted to patient care services, exclusive of separately billable procedures, described in this note is 35 minutes.   Chilton GreathousePraveen Adan Baehr MD [facility] Pager [phone] If no answer or after 3pm call: [phone] 03/15/2018, 7:46 AM

## 2018-04-04 NOTE — Procedures (Signed)
Arterial Catheter Insertion Procedure Note Craig Mason [id number] 04/26/1950  Procedure: Insertion of Arterial Catheter  Indications: Blood pressure monitoring  Procedure Details Consent: Risks of procedure as well as the alternatives and risks of each were explained to the (patient/caregiver).  Consent for procedure obtained. Time Out: Verified patient identification, verified procedure, site/side was marked, verified correct patient position, special equipment/implants available, medications/allergies/relevent history reviewed, required imaging and test results available.  Performed  Maximum sterile technique was used including antiseptics, cap, gloves, gown, hand hygiene, mask and sheet. Skin prep: Chlorhexidine; local anesthetic administered 20 gauge catheter was inserted into right radial artery using the Seldinger technique. ULTRASOUND GUIDANCE USED: NO Evaluation Blood flow good; BP tracing good. Complications: No apparent complications.   Craig MoodKarsen A Lukas Mason 03/18/2018

## 2018-04-04 NOTE — Progress Notes (Signed)
Results of ABG given to RN.

## 2018-04-04 NOTE — Progress Notes (Signed)
ANTICOAGULATION CONSULT NOTE  Pharmacy Consult for heparin  Indication: VTE treatment   Patient Measurements: Height: 5\' 8"  (172.7 cm) Weight: 165 lb 5.5 oz (75 kg) IBW/kg (Calculated) : 68.4 Heparin Dosing Weight: 70kg  Vital Signs: Temp: 100 F (37.8 C) (12/21 1630) BP: 182/59 (12/21 1630) Pulse Rate: 117 (12/21 1630)  Labs: Recent Labs    04/07/2018 0145 03/22/2018 0249 04/05/2018 0830 03/29/2018 1050 04/03/2018 1100 04/14/2018 1513 03/19/2018 1600 03/26/2018 1700  HGB 11.2* 11.3*  --  10.2* 10.7*  --   --   --   HCT 39.2 38.8*  --  30.0* 34.6*  --   --   --   PLT 273 198  --   --  160  --   --   --   APTT 29  --  64*  --   --   --   --   --   LABPROT 17.7*  --  17.0*  --   --   --   --   --   INR 1.48  --  1.40  --   --   --   --   --   HEPARINUNFRC  --   --   --   --   --   --   --  1.68*  CREATININE 7.68* 7.15* 6.22* 5.40*  --   --  4.90*  --   TROPONINI  --  1.06* 1.50*  --   --  4.78*  --   --      Assessment: 67 year old man, former smoker, with dementia, CVA, T2DM, history of respiratory failure 2/2 pneumonia with previous trach (decannulated now), GERD presenting with altered mental status.   Echo shows dilated RV, new orders to start empiric heparin unitl PE and DVT are ruled out. Note patient started on CRRT this am. CBC within normal limits. The initial heparin level returned supratherapeutic. The RN states this was drawn appropriately via a-line. RN also states ACTs associated with CRRT heparin have also been high. Therefore, I will reduce the rate of heparin.    Goal of Therapy:  Heparin level 0.3-0.7 units/ml Monitor platelets by anticoagulation protocol: Yes   Plan:  Follow up imaging to rule out VTE Reduce heparin to 850 units/hr Daily HL, CBC Next level with AM labs   Baldemar FridayMasters, Alyha Marines M 03/28/2018 6:02 PM

## 2018-04-04 NOTE — Progress Notes (Signed)
CRITICAL VALUE ALERT  Critical Value:  Potassium 7   Troponin 1.06   Lactic acid 12.1   Date & Time Notied:  13-Feb-2018 0530  Provider Notified: Pola CornELink  Orders Received/Actions taken:

## 2018-04-04 NOTE — Progress Notes (Signed)
ANTICOAGULATION CONSULT NOTE - Initial Consult  Pharmacy Consult for heparin  Indication: VTE treatment  No Known Allergies  Patient Measurements: Height: 5\' 2"  (157.5 cm) Weight: 165 lb 5.5 oz (75 kg) IBW/kg (Calculated) : 54.6 Heparin Dosing Weight: 70kg  Vital Signs: Temp: 94.5 F (34.7 C) (12/21 0800) Temp Source: Core (12/21 0055) BP: 100/58 (12/21 0800) Pulse Rate: 119 (12/21 0800)  Labs: Recent Labs    04/07/2018 0145 04/03/2018 0249  HGB 11.2* 11.3*  HCT 39.2 38.8*  PLT 273 198  APTT 29  --   LABPROT 17.7*  --   INR 1.48  --   CREATININE 7.68* 7.15*  TROPONINI  --  1.06*    Estimated Creatinine Clearance: 8.8 mL/min (A) (by C-G formula based on SCr of 7.15 mg/dL (H)).   Medical History: No past medical history on file.  Assessment: 67 year old man, former smoker, with dementia, CVA, T2DM, history of respiratory failure 2/2 pneumonia with previous trach (decannulated now), GERD presenting with altered mental status.   Echo shows dilated RV, new orders to start empiric heparin while PE and DVT can be ruled in/out. Note patient started on CRRT this am. CBC within normal limits.   Goal of Therapy:  Heparin level 0.3-0.7 units/ml Monitor platelets by anticoagulation protocol: Yes   Plan:  Follow up imaging to rule out VTE Give 4000 units bolus x 1 Start heparin infusion at 1050 units/hr Check anti-Xa level in 8 hours and daily while on heparin Continue to monitor H&H and platelets  Sheppard CoilFrank Cythina Mickelsen PharmD., BCPS Clinical Pharmacist 03/24/2018 8:27 AM

## 2018-04-04 NOTE — Progress Notes (Signed)
Initial Nutrition Assessment  DOCUMENTATION CODES:   Non-severe (moderate) malnutrition in context of chronic illness  INTERVENTION:   Tube Feeding Recommendations when appropriate:  Vital AF 1.2 @ 55 ml/hr Pro-Stat 30 mL TID Provide 144 g of protein, 1884 kcals, 1069 mL of free water Follow electrolytes and make further recommendations as needed  NUTRITION DIAGNOSIS:   Moderate Malnutrition related to chronic illness(chronic respiratory failure, dementia, FTT) as evidenced by moderate fat depletion, moderate muscle depletion, percent weight loss.  GOAL:   Patient will meet greater than or equal to 90% of their needs  MONITOR:   Vent status, Labs, Weight trends  REASON FOR ASSESSMENT:   Ventilator    ASSESSMENT:    67 yo male admitted with AMS, hypothermia, hypotensive in multi-organ failure with septic shock, AKI with hyperkalemia and metabolic acidosis requiring RRT, intubated. PMH includes DM, respiratory failure requiring trach in the past, dementia. [facility] [facility] [place] [facility]  12/21 Admit, CRRT initiated   OG tube in place with dark brown/reddish drainage  Patient is currently intubated on ventilator support, currently maxed out on pressors,  unresponsive off sedation MV: 14.2 L/min Temp (24hrs), Avg:96 F (35.6 C), Min:92.5 F (33.6 C), Max:99.3 F (37.4 C)  Report 100 pound wt loss in <1 year. Reports he weighed 260 pounds and recently weighed 156 pounds. 40% wt loss in <1 year.  Pt had not been eating well at SNF, pt did not like the food. Family tried to bring in food at least every other day but pt not eating well. Pt not taking oral nutrition supplements either  Height of 5'2" in computer. Per family, pt around 5'8". Height changed in computer  Foul smelling stool leaking around rectal tube, RN notified  Based on physical exam and percent weight loss, pt meets criteria for  MODERATE malnutrition. Pt very likely has Severe malnutrition but unable to obtain very detailed diet history  Labs: sodium 147, potassium 7.0 (H), Creatinine 7.15, BUN 98, phosphorus 12.8 (H), CBGs 217-247, ammonia 63 Meds: reviewed  NUTRITION - FOCUSED PHYSICAL EXAM:    Most Recent Value  Orbital Region  Moderate depletion  Upper Arm Region  Moderate depletion  Thoracic and Lumbar Region  Moderate depletion  Buccal Region  Moderate depletion  Temple Region  Moderate depletion  Clavicle Bone Region  Moderate depletion  Clavicle and Acromion Bone Region  Moderate depletion  Scapular Bone Region  Moderate depletion  Dorsal Hand  Mild depletion  Patellar Region  Moderate depletion  Anterior Thigh Region  Moderate depletion  Posterior Calf Region  Moderate depletion  Edema (RD Assessment)  None       Diet Order:   Diet Order            Diet NPO time specified  Diet effective now              EDUCATION NEEDS:      Skin:  Skin Assessment: Skin Integrity Issues: Skin Integrity Issues:: Stage II Stage II: coccyx  Last BM:  12/21  Height:   Ht Readings from Last 1 Encounters:  03/19/2018 5\' 8"  (1.727 m)    Weight:   Wt Readings from Last 1 Encounters:  03/16/2018 75 kg    Ideal Body Weight:     BMI:  Body mass index is 25.14 kg/m.  Estimated Nutritional Needs:   Kcal:  1875 kcals   Protein:  135-165 g  Fluid:  per MD  Romelle Starcherate Twain Stenseth MS, RD, LDN, CNSC [phone] Pager  [phone] Weekend/On-Call Pager

## 2018-04-04 NOTE — Progress Notes (Signed)
MD wanted ABG done after an hour of being on CRRT per RN. RT came up to get the gas and was informed that they had to restart CRRT due to clotting and started 6 minutes ago. RT will get the abg around 0600. RN aware.

## 2018-04-04 NOTE — Procedures (Signed)
Arterial Catheter Insertion Procedure Note Raelene Bottddie Dubois [id number] 12/17/1950  Procedure: Insertion of Arterial Catheter  Indications: Blood pressure monitoring and Frequent blood sampling  Procedure Details Consent: Risks of procedure as well as the alternatives and risks of each were explained to the (patient/caregiver).  Consent for procedure obtained. Time Out: Verified patient identification, verified procedure, site/side was marked, verified correct patient position, special equipment/implants available, medications/allergies/relevent history reviewed, required imaging and test results available.  Performed  Maximum sterile technique was used including antiseptics, cap, gloves, gown, hand hygiene, mask and sheet. Skin prep: Chlorhexidine; local anesthetic administered 20 gauge catheter was inserted into right femoral artery using the Seldinger technique.  Evaluation Blood flow good; BP tracing good. Complications: No apparent complications.   Brett CanalesSteve Minor ACNP [facility] Pager [phone] till 3 pm If no answer page [phone] 11-02-2017, 8:42 AM

## 2018-04-04 NOTE — Progress Notes (Signed)
CRITICAL VALUE ALERT  Critical Value:  Calcium 6.0  Date & Time Notied:  2018/02/09 1755  Provider Notified: CCM MD Mannam   Orders Received/Actions taken: 2g Ca Gluconate ordered

## 2018-04-04 NOTE — Progress Notes (Signed)
ABG results given to MD.  

## 2018-04-04 NOTE — Progress Notes (Signed)
ABG results given to MD. No changes being made at this time.

## 2018-04-04 NOTE — Progress Notes (Signed)
CRITICAL VALUE ALERT  Critical Value:  Lactic acid-11.4  Date & Time Notied:  Aug 13, 2017 2:51 AM  Provider Notified: Nehemiah SettleBrooke, NP  Orders Received/Actions taken: none at this time

## 2018-04-04 NOTE — Consult Note (Signed)
[facility] Renal Consultation Note  Requesting MD: Mannam Indication for Consultation: presumed AKI- metabolic acidosis and hyperkalemia   HPI:  Craig Mason is a 67 y.o. male with past medical history significant for type 2 diabetes mellitus, former smoker with respiratory failure in the past and previous trach, dementia unknown severity, skilled nursing facility patient.  He was brought into an outside ER less responsive.  He is found to be hypothermic, hypotensive, acute kidney injury with metabolic acidosis-pH less than 7.0 with hyperkalemia.  I was consulted in the middle of the night to initiate CRRT.  He is currently intubated-on 3 pressors-as of labs at 2 AM, prior to significant renal replacement therapy potassium was 7 and creatinine was 7 and bicarb was less than 7-lactate of 12  Creatinine, Ser  Date/Time Value Ref Range Status  03/16/2018 02:49 AM 7.15 (H) 0.61 - 1.24 mg/dL Final  16/01/9603 54:09 AM 7.68 (H) 0.61 - 1.24 mg/dL Final     PMHx:  No past medical history on file.    Family Hx: No family history on file.  Social History:  has no history on file for tobacco, alcohol, and drug.  Allergies: No Known Allergies  Medications: Prior to Admission medications   Not on File    I have reviewed the patient's current medications.  Labs:  Results for orders placed or performed during the hospital encounter of 03/29/2018 (from the past 48 hour(s))  Glucose, capillary     Status: Abnormal   Collection Time: 04/10/2018 12:57 AM  Result Value Ref Range   Glucose-Capillary 216 (H) 70 - 99 mg/dL  MRSA PCR Screening     Status: None   Collection Time: 03/25/2018 12:59 AM  Result Value Ref Range   MRSA by PCR NEGATIVE NEGATIVE    Comment:        The GeneXpert MRSA Assay (FDA approved for NASAL specimens only), is one component of a comprehensive MRSA colonization surveillance program. It is not intended to diagnose MRSA infection nor to guide  or monitor treatment for MRSA infections. Performed at [facility], [address] [place], [place] [address]   I-STAT 3, arterial blood gas (G3+)     Status: Abnormal   Collection Time: 03/27/2018  1:38 AM  Result Value Ref Range   pH, Arterial 6.875 (LL) 7.350 - 7.450   pCO2 arterial 20.3 (L) 32.0 - 48.0 mmHg   pO2, Arterial 176.0 (H) 83.0 - 108.0 mmHg   Bicarbonate 4.0 (L) 20.0 - 28.0 mmol/L   TCO2 <5 (L) 22 - 32 mmol/L   O2 Saturation 98.0 %   Acid-base deficit 29.0 (H) 0.0 - 2.0 mmol/L   Patient temperature 34.1 C    Collection site ARTERIAL LINE    Drawn by RT    Sample type ARTERIAL    Comment NOTIFIED PHYSICIAN   Comprehensive metabolic panel     Status: Abnormal   Collection Time: 03/19/2018  1:45 AM  Result Value Ref Range   Sodium 144 135 - 145 mmol/L   Potassium 7.3 (HH) 3.5 - 5.1 mmol/L    Comment: NO VISIBLE HEMOLYSIS CRITICAL RESULT CALLED TO, READ BACK BY AND VERIFIED WITH: J.PARRISH,RN 4782 04/03/2018 M.CAMPBELL    Chloride 108 98 - 111 mmol/L   CO2 <7 (L) 22 - 32 mmol/L   Glucose, Bld 252 (H) 70 - 99 mg/dL   BUN 956 (H) 8 - 23 mg/dL   Creatinine, Ser 2.13 (H) 0.61 - 1.24 mg/dL   Calcium  7.6 (L) 8.9 - 10.3 mg/dL   Total Protein 4.8 (L) 6.5 - 8.1 g/dL   Albumin 2.2 (L) 3.5 - 5.0 g/dL   AST 161258 (H) 15 - 41 U/L   ALT 123 (H) 0 - 44 U/L   Alkaline Phosphatase 129 (H) 38 - 126 U/L   Total Bilirubin 1.8 (H) 0.3 - 1.2 mg/dL   GFR calc non Af Amer 7 (L) >60 mL/min   GFR calc Af Amer 8 (L) >60 mL/min    Comment: Performed at [facility], [address] [place], [place] [address]  Magnesium     Status: None   Collection Time: Aug 13, 2017  1:45 AM  Result Value Ref Range   Magnesium 2.2 1.7 - 2.4 mg/dL    Comment: Performed at [facility], [address] [place], [place] [address]  Phosphorus     Status: Abnormal   Collection Time: Aug 13, 2017  1:45 AM  Result Value Ref Range   Phosphorus 13.6 (H) 2.5 - 4.6 mg/dL    Comment: RESULTS CONFIRMED BY  MANUAL DILUTION Performed at [facility], [address] [place], [place] [address]   Lactic acid, plasma     Status: Abnormal   Collection Time: Aug 13, 2017  1:45 AM  Result Value Ref Range   Lactic Acid, Venous 11.4 (HH) 0.5 - 1.9 mmol/L    Comment: RESULTS CONFIRMED BY MANUAL DILUTION CRITICAL RESULT CALLED TO, READ BACK BY AND VERIFIED WITH: CUMMINGS B,RN Aug 13, 2017 0248 WAYK Performed at [facility], [address] [place], [place] [address]   CBC WITH DIFFERENTIAL     Status: Abnormal   Collection Time: Aug 13, 2017  1:45 AM  Result Value Ref Range   WBC 27.6 (H) 4.0 - 10.5 K/uL    Comment: REPEATED TO VERIFY   RBC 3.44 (L) 4.22 - 5.81 MIL/uL   Hemoglobin 11.2 (L) 13.0 - 17.0 g/dL   HCT 62.139.2 30.839.0 - 65.752.0 %   MCV 114.0 (H) 80.0 - 100.0 fL   MCH 32.6 26.0 - 34.0 pg   MCHC 28.6 (L) 30.0 - 36.0 g/dL   RDW 84.613.2 96.211.5 - 95.215.5 %   Platelets 273 150 - 400 K/uL   nRBC 0.0 0.0 - 0.2 %   Neutrophils Relative % 78 %   Lymphocytes Relative 10 %   Monocytes Relative 11 %   Eosinophils Relative 0 %   Basophils Relative 1 %   Band Neutrophils 0 %   Metamyelocytes Relative 0 %   Myelocytes 0 %   Promyelocytes Relative 0 %   Blasts 0 %   nRBC 0 0 /100 WBC   Other 0 %   Neutro Abs 21.5 (H) 1.7 - 7.7 K/uL   Lymphs Abs 2.8 0.7 - 4.0 K/uL   Monocytes Absolute 3.0 (H) 0.1 - 1.0 K/uL   Eosinophils Absolute 0.0 0.0 - 0.5 K/uL   Basophils Absolute 0.3 (H) 0.0 - 0.1 K/uL   RBC Morphology BURR CELLS    WBC Morphology MILD LEFT SHIFT (1-5% METAS, OCC MYELO, OCC BANDS)     Comment: Performed at [facility], [address] [place], [place] [address]  Protime-INR     Status: Abnormal   Collection Time: Aug 13, 2017  1:45 AM  Result Value Ref Range   Prothrombin Time 17.7 (H) 11.4 - 15.2 seconds   INR 1.48     Comment: Performed at [facility], [address] [place], [place] [address]  APTT  Status: None   Collection Time: 03/21/2018  1:45 AM  Result Value Ref Range    aPTT 29 24 - 36 seconds    Comment: Performed at [facility], [address] [place], [place] [address]  Type and screen If need to transfuse blood products please use the blood administration order set     Status: None   Collection Time: 03/18/2018  1:45 AM  Result Value Ref Range   ABO/RH(D) O POS    Antibody Screen NEG    Sample Expiration      04/07/2018 Performed at [facility], [address], [place], [place] [address]   ABO/Rh     Status: None (Preliminary result)   Collection Time: 04/07/2018  1:45 AM  Result Value Ref Range   ABO/RH(D)      O POS Performed at [facility], [address] [place], [place] [address]   CBC     Status: Abnormal   Collection Time: 03/16/2018  2:49 AM  Result Value Ref Range   WBC 21.0 (H) 4.0 - 10.5 K/uL   RBC 3.47 (L) 4.22 - 5.81 MIL/uL   Hemoglobin 11.3 (L) 13.0 - 17.0 g/dL   HCT 69.6 (L) 29.5 - 28.4 %   MCV 111.8 (H) 80.0 - 100.0 fL   MCH 32.6 26.0 - 34.0 pg   MCHC 29.1 (L) 30.0 - 36.0 g/dL   RDW 13.2 44.0 - 10.2 %   Platelets 198 150 - 400 K/uL   nRBC 0.1 0.0 - 0.2 %    Comment: Performed at [facility], [address], [place], [place] [address]  Basic metabolic panel     Status: Abnormal   Collection Time: 03/24/2018  2:49 AM  Result Value Ref Range   Sodium 147 (H) 135 - 145 mmol/L   Potassium 7.0 (HH) 3.5 - 5.1 mmol/L    Comment: CRITICAL RESULT CALLED TO, READ BACK BY AND VERIFIED WITH: CUMMINGS B,RN 04/12/2018 0527 WAYK    Chloride 108 98 - 111 mmol/L   CO2 <7 (L) 22 - 32 mmol/L   Glucose, Bld 268 (H) 70 - 99 mg/dL   BUN 98 (H) 8 - 23 mg/dL   Creatinine, Ser 6.44 (H) 0.61 - 1.24 mg/dL   Calcium 7.2 (L) 8.9 - 10.3 mg/dL   GFR calc non Af Amer 7 (L) >60 mL/min   GFR calc Af Amer 8 (L) >60 mL/min    Comment: Performed at [facility], [address] [place], [place] [address]  Magnesium     Status: None   Collection Time: 03/27/2018  2:49 AM  Result Value Ref Range   Magnesium 2.1 1.7 - 2.4 mg/dL     Comment: Performed at [facility], [address] [place], [place] [address]  Phosphorus     Status: Abnormal   Collection Time: 04/10/2018  2:49 AM  Result Value Ref Range   Phosphorus 12.8 (H) 2.5 - 4.6 mg/dL    Comment: RESULTS CONFIRMED BY MANUAL DILUTION Performed at [facility], [address], [place], [place] [address]   Troponin I - Now Then Q6H     Status: Abnormal   Collection Time: 03/26/2018  2:49 AM  Result Value Ref Range   Troponin I 1.06 (HH) <0.03 ng/mL    Comment: CRITICAL RESULT CALLED TO, READ BACK BY AND VERIFIED WITH: CUMMINGS B,RN 03/30/2018 0527 WAYK Performed at [facility], [address] [place], [place] [address]   Glucose, capillary     Status: Abnormal   Collection Time: 04/08/2018  3:40 AM  Result Value Ref Range   Glucose-Capillary 222 (H) 70 - 99 mg/dL  I-STAT 3, arterial blood gas (G3+)     Status: Abnormal   Collection Time: 03/29/2018  3:46 AM  Result Value Ref Range   pH, Arterial 6.985 (LL) 7.350 - 7.450   pCO2 arterial 20.4 (L) 32.0 - 48.0 mmHg   pO2, Arterial 156.0 (H) 83.0 - 108.0 mmHg   Bicarbonate 5.1 (L) 20.0 - 28.0 mmol/L   TCO2 6 (L) 22 - 32 mmol/L   O2 Saturation 98.0 %   Acid-base deficit 25.0 (H) 0.0 - 2.0 mmol/L   Patient temperature 33.8 C    Collection site ARTERIAL LINE    Drawn by RT    Sample type ARTERIAL    Comment NOTIFIED PHYSICIAN   Lactic acid, plasma     Status: Abnormal   Collection Time: 03/31/2018  3:56 AM  Result Value Ref Range   Lactic Acid, Venous 12.1 (HH) 0.5 - 1.9 mmol/L    Comment: RESULTS CONFIRMED BY MANUAL DILUTION CRITICAL RESULT CALLED TO, READ BACK BY AND VERIFIED WITH: CUMMINGS B,RN 04/14/2018 0530 WAYK Performed at [facility], [address] [place], [place] [address]   Ammonia     Status: Abnormal   Collection Time: 04/06/2018  4:30 AM  Result Value Ref Range   Ammonia 63 (H) 9 - 35 umol/L    Comment: Performed at [facility], [address] [place],  [place] [address]  POCT Activated clotting time     Status: None   Collection Time: 04/01/2018  4:30 AM  Result Value Ref Range   Activated Clotting Time 158 seconds  Glucose, capillary     Status: Abnormal   Collection Time: 03/18/2018  5:47 AM  Result Value Ref Range   Glucose-Capillary 247 (H) 70 - 99 mg/dL  I-STAT 3, arterial blood gas (G3+)     Status: Abnormal   Collection Time: 03/22/2018  6:05 AM  Result Value Ref Range   pH, Arterial 7.017 (LL) 7.350 - 7.450   pCO2 arterial 32.2 32.0 - 48.0 mmHg   pO2, Arterial 138.0 (H) 83.0 - 108.0 mmHg   Bicarbonate 8.7 (L) 20.0 - 28.0 mmol/L   TCO2 10 (L) 22 - 32 mmol/L   O2 Saturation 98.0 %   Acid-base deficit 22.0 (H) 0.0 - 2.0 mmol/L   Patient temperature 92.7 F    Collection site FEMORAL ARTERY    Sample type ARTERIAL    Comment NOTIFIED PHYSICIAN   POCT Activated clotting time     Status: None   Collection Time: 04/07/2018  6:10 AM  Result Value Ref Range   Activated Clotting Time 147 seconds  POCT Activated clotting time     Status: None   Collection Time: 03/30/2018  6:58 AM  Result Value Ref Range   Activated Clotting Time 175 seconds  Glucose, capillary     Status: Abnormal   Collection Time: 03/28/2018  7:57 AM  Result Value Ref Range   Glucose-Capillary 217 (H) 70 - 99 mg/dL     ROS:  Review of systems not obtained due to patient factors.  Physical Exam: Vitals:   03/24/2018 0737 04/10/2018 0800  BP: (!) 101/47 (!) 100/58  Pulse: (!) 119 (!) 119  Resp: (!) 30 (!) 30  Temp:  (!) 94.5 F (34.7 C)  SpO2: 99%  99%     General: Sedated on vent HEENT: Pupils are reactive Neck: No JVD Heart: Tachycardic Lungs: Coarse breath sounds bilaterally Abdomen: Distended Extremities: Minimal edema Skin: Warm and dry Neuro: Sedated on vent  Assessment/Plan: 67 year old but chronically ill skilled nursing facility resident presenting with hypothermia, hypotension, acute kidney injury with severe metabolic acidosis, lactate of 12 with  hyperkalemia 1.Renal-presumed acute kidney injury-no prior data known as far as kidney function.  Severe hypotension possibly sepsis-requiring a great deal of support.  Do not have labs reflective of CRRT as of yet.  We will follow-up on them later.  Doing pre-and post filter sodium bicarbonate given advanced acidosis 2. Hypertension/volume  -severely hypotensive requiring pressor support.  Will talk to CCM about bolusing outside of the CRRT.  Currently not pulling anything- think could use some fluid  3.  Hyperkalemia and metabolic acidosis-related, elevated lactate.  Supportive care.  Pre-and post filter fluids bicarb only 4. Anemia  -not an issue 5.  Prognosis appears grave to me   Cecille Aver Apr 27, 2018, 8:35 AM

## 2018-04-04 NOTE — Procedures (Signed)
Central Venous Catheter Insertion Procedure Note Craig Mason [id number] [birth year]  Procedure: Insertion of Central Venous Temporary Dialysis Catheter Indications: continuous renal replacement therapy for hyperkalemia and metabolic acidosis  Procedure Details Consent: Risks of procedure as well as the alternatives and risks of each were explained to the (patient/caregiver).  Consent for procedure obtained. Time Out: Verified patient identification, verified procedure, site/side was marked, verified correct patient position, special equipment/implants available, medications/allergies/relevent history reviewed, required imaging and test results available.  Performed  Maximum sterile technique was used including antiseptics, cap, gloves, gown, hand hygiene, mask and sheet. Skin prep: Chlorhexidine A antimicrobial bonded/coated triple lumen Trialysis catheter was placed in the right internal jugular vein using the Seldinger technique with ultrasound guidance  Evaluation Blood flow good Complications: No apparent complications Patient did tolerate procedure well. Chest X-ray ordered to verify placement.  CXR: normal.  Craig Mason, M.D. [facility] Medicine After hours pager: [phone] 03/20/2018, 3:35 AM

## 2018-04-04 NOTE — H&P (Signed)
NAME:  Craig Mason, MRN:  [id number], DOB:  1951-02-02, LOS: 0 ADMISSION DATE:  26-Jun-2017, CHIEF COMPLAINT:  Altered mental status  History of present illness   67 year old man, former smoker, with dementia, CVA, T2DM, history of respiratory failure 2/2 pneumonia with previous trach (decannulated now), GERD presenting with altered mental status. Lives at [facility]. Sister noted that his mental status had changed for the last week - less conversant and interactive. Acute worsening of his mental status today. He had an episode of emesis this morning while eating. Otherwise no noted changes in symptoms. No indications of chest or abdominal pain. History obtained from sister since patient is acutely encephalopathic. At baseline he is conversant and oriented x 2 (gets confused about month).  At [facility], received 2L NS bolus and started on Levo gtt. He was also given vancomycin and cefepime. Also received 1 amp of bicarb, 1g calcium gluconate, insulin and started on a bicarb gtt. He was started on neosynephrine gtt en route.   Past Medical History  former smoker, with dementia, history CVA, T2DM, history of respiratory failure 2/2 pneumonia with previous trach (decannulated now), GERD, osteoporosis  Significant Hospital Events   12/21 admission, vas cath placement and CRRT initiated  Consults:  Nephrology  Procedures:  12/21 vas cath placement  Significant Diagnostic Tests:  CT head w/o contrast on 12/20: No CT evidence for acute intracranial abnormality. Atrophy and mild small vessel ischemic changes of the white matter  CXR 12/21: ETT and OGT in place, no acute cardiopulm disease, aortic atherosclerosis  Micro Data:  BCx 12/21 pending Resp Cx 12/21 pending  Antimicrobials:  Vanc 12/21> Cefepime 12/21>  Interim history/subjective:  Intubated and encephalopathic  Objective   Blood pressure 103/63, pulse (!) 113, temperature (!) 93.4 F (34.1 C), resp. rate (!) 24,  weight 75.1 kg, SpO2 98 %.    Vent Mode: PRVC FiO2 (%):  [35 %] 35 % Set Rate:  [24 bmp] 24 bmp Vt Set:  [600 mL] 600 mL PEEP:  [5 cmH20] 5 cmH20 Plateau Pressure:  [14 cmH20] 14 cmH20  No intake or output data in the 24 hours ending May 17, 2017 0251 Filed Weights   May 17, 2017 0055  Weight: 75.1 kg    Examination: General: Eyes open but unresponsive, NAD HENT: pupils equal and reactive, normal mucous membranes Lungs: clear to auscultation bilaterally Cardiovascular: tachycardic but regular rhythm Abdomen: soft, nondistended, nontender Extremities: cool to touch, no edema Neuro: Does not follow commands GU: Foley in place   Assessment & Plan:  67 year old man, former smoker, with dementia, CVA, T2DM, history of respiratory failure 2/2 pneumonia with previous trach (decannulated now), GERD presenting with altered mental status.   Acute encephalopathy, probable septic shock with lactic acidosis: Multifactorial with possible sepsis. He has elevated ammonia level and uremia. Possible that home medications could be contributing. TSH normal. CT head and CXR with no acute abnormality. LA 14.3 at OSH. Ammonia level 216. WBC 27.6 -Continue home donepezil, hold home venlafaxine, trazodone, tramadol and Percocet for now -Follow up blood cultures -Recheck ammonia level, consider lactulose  -Continue vanc and cefepime -Trend lactic acid  Acute kidney injury with hyperkalemia and metabolic acidosis with increased anion gap: K 8.5 and bicarb 4 with creatinine 7.93 (baseline 0.9) and BUN 109 at OSH. EtOH neg and CK normal.  -CRRT initiated -Renal ultrasound -Renal consulted  DM2 with hyperglycemia: -Subq insulin per ICU protocol -Hold home long acting insulin for now -Hold home metformin  Troponinemia, possible demand ischemia:  Trop 0.076 increased to 0.174 12/20. BNP 340. EKG noted with sinus tachycardia, RBBB, new T wave inversions in inferior leads -Check TTE -Recheck EKG -Continue home  statin -ASA daily  Acute anemia: Hgb stable around 11. Baseline around 12-13. No signs of active bleeding -Continue to monitor daily CBC  Best practice:  Diet: NPO Pain/Anxiety/Delirium protocol (if indicated): fent prn VAP protocol (if indicated): ordered DVT prophylaxis: subq heparin GI prophylaxis: pepcid per tube  Glucose control: SSI Mobility: PT as able Code Status: DNR confirmed with sister.  Family Communication: Sister is healthcare POA. Updated her and brother in law at bedside.  Disposition: MICU  Labs   CBC: Recent Labs  Lab 2017/07/15 0145  WBC 27.6*  NEUTROABS 21.5*  HGB 11.2*  HCT 39.2  MCV 114.0*  PLT 273    Basic Metabolic Panel: Recent Labs  Lab 2017/07/15 0145  NA 144  K 7.3*  CL 108  CO2 <7*  GLUCOSE 252*  BUN 103*  CREATININE 7.68*  CALCIUM 7.6*  MG 2.2  PHOS 13.6*   GFR: CrCl cannot be calculated (Unknown ideal weight.). Recent Labs  Lab 2017/07/15 0145  WBC 27.6*  LATICACIDVEN 11.4*    Liver Function Tests: Recent Labs  Lab 2017/07/15 0145  AST 258*  ALT 123*  ALKPHOS 129*  BILITOT 1.8*  PROT 4.8*  ALBUMIN 2.2*    ABG    Component Value Date/Time   PHART 6.875 (LL) 01/20/18 0138   PCO2ART 20.3 (L) 01/20/18 0138   PO2ART 176.0 (H) 01/20/18 0138   HCO3 4.0 (L) 01/20/18 0138   TCO2 <5 (L) 01/20/18 0138   ACIDBASEDEF 29.0 (H) 01/20/18 0138   O2SAT 98.0 01/20/18 0138     Coagulation Profile: Recent Labs  Lab 2017/07/15 0145  INR 1.48    CBG: Recent Labs  Lab 2017/07/15 0057  GLUCAP 216*    Review of Systems:   Unable to obtain due to intubation, encephalopathy  Surgical History   PEG tube removal Knee arthroscopy  Social History  Former smoker, no alcohol  Family History    Diabetes Brother  Heart failure Brother  Diabetes Father  Heart failure Father  Diabetes Mother  Heart failure Mother  Diabetes Sister  Heart failure Sister   Allergies No Known Allergies   Home Medications    Prior to Admission medications   Not on File     Critical care time: The patient is critically ill with multiple organ systems failure and requires high complexity decision making for assessment and support, frequent evaluation and titration of therapies, application of advanced monitoring technologies and extensive interpretation of multiple databases.   Critical Care Time devoted to patient care services described in this note is  50 Minutes. This time reflects time of care of this signee. This critical care time does not reflect procedure time, or teaching time or supervisory time of PA/NP/Med student/Med Resident etc but could involve care discussion time.  Griffin BasilJennifer Baudelia Schroepfer, M.D. [facility] After hours pager: [phone].

## 2018-04-04 NOTE — Progress Notes (Signed)
Pharmacy Antibiotic Note  Raelene Bottddie Nakamura is a 67 y.o. male admitted on 03/20/2018 with AMS, ARF, and septic shock.  Pharmacy has been consulted for Vancomycin and Cefepime dosing. Vancomycin 1500 mg IV [facility] at [facility] at 2100 12/20.  CRRT started at 0315  Plan: Vancomycin 750 mg IV q24h Cefepime 2 g IV q12h  Height: 5\' 2"  (157.5 cm) Weight: 165 lb 9.1 oz (75.1 kg) IBW/kg (Calculated) : 54.6  Temp (24hrs), Avg:93 F (33.9 C), Min:92.7 F (33.7 C), Max:93.4 F (34.1 C)  Recent Labs  Lab 04/10/2018 0145 04/09/2018 0249  WBC 27.6* 21.0*  CREATININE 7.68* 7.15*  LATICACIDVEN 11.4*  --     Estimated Creatinine Clearance: 8.8 mL/min (A) (by C-G formula based on SCr of 7.15 mg/dL (H)).    No Known Allergies  Kosei Candlebbott, Cardale Dorer [place] 03/20/2018 5:30 AM

## 2018-04-04 NOTE — Progress Notes (Addendum)
  Echocardiogram 2D Echocardiogram has been performed.   Nurse has contacted Dr. Isaiah SergeMannam to inform that echo will not be read until tomorrow unless cardiology is contacted.  Craig Mason, Craig Mason 03/21/2018, 6:10 PM

## 2018-04-04 NOTE — Progress Notes (Signed)
PCCM progress note  Continues on levo, neo-, vasopressin Started on heparin for elevated troponin  Potassium 5.5, BUN/creatinine 28/81, troponin IV 0.78, lactic acid 7.6  Echocardiogram is pending.  We will hold off calling cardiology right now until echo is complete  Updated sister and brother-in-law in detail at bedside We are maxed out on pressors Continue current management with no escalation Code status is DNR  Craig GreathousePraveen Mariesa Grieder MD [place] [facility] Pager [phone] If no answer or after 3pm call: [phone] 03/16/2018, 4:15 PM

## 2018-04-05 ENCOUNTER — Encounter (HOSPITAL_COMMUNITY): Payer: Medicaid Other

## 2018-04-05 ENCOUNTER — Inpatient Hospital Stay (HOSPITAL_COMMUNITY): Payer: Medicare (Managed Care)

## 2018-04-05 DIAGNOSIS — K922 Gastrointestinal hemorrhage, unspecified: Secondary | ICD-10-CM

## 2018-04-05 DIAGNOSIS — J9600 Acute respiratory failure, unspecified whether with hypoxia or hypercapnia: Secondary | ICD-10-CM

## 2018-04-05 DIAGNOSIS — R7989 Other specified abnormal findings of blood chemistry: Secondary | ICD-10-CM

## 2018-04-05 DIAGNOSIS — D500 Iron deficiency anemia secondary to blood loss (chronic): Secondary | ICD-10-CM

## 2018-04-05 LAB — COMPREHENSIVE METABOLIC PANEL
ALT: 86 U/L — ABNORMAL HIGH (ref 0–44)
AST: 141 U/L — ABNORMAL HIGH (ref 15–41)
Albumin: 2.1 g/dL — ABNORMAL LOW (ref 3.5–5.0)
Alkaline Phosphatase: 92 U/L (ref 38–126)
Anion gap: 30 — ABNORMAL HIGH (ref 5–15)
BUN: 56 mg/dL — ABNORMAL HIGH (ref 8–23)
CO2: 23 mmol/L (ref 22–32)
Calcium: 5.9 mg/dL — CL (ref 8.9–10.3)
Chloride: 87 mmol/L — ABNORMAL LOW (ref 98–111)
Creatinine, Ser: 3.77 mg/dL — ABNORMAL HIGH (ref 0.61–1.24)
GFR calc Af Amer: 18 mL/min — ABNORMAL LOW (ref >60)
GFR calc non Af Amer: 15 mL/min — ABNORMAL LOW (ref >60)
Glucose, Bld: 127 mg/dL — ABNORMAL HIGH (ref 70–99)
Potassium: 4.3 mmol/L (ref 3.5–5.1)
Sodium: 140 mmol/L (ref 135–145)
Total Bilirubin: 2.1 mg/dL — ABNORMAL HIGH (ref 0.3–1.2)
Total Protein: 4.3 g/dL — ABNORMAL LOW (ref 6.5–8.1)

## 2018-04-05 LAB — CBC
HCT: 24.1 % — ABNORMAL LOW (ref 39.0–52.0)
HCT: 22.3 % — ABNORMAL LOW (ref 39.0–52.0)
Hemoglobin: 8.4 g/dL — ABNORMAL LOW (ref 13.0–17.0)
Hemoglobin: 7.2 g/dL — ABNORMAL LOW (ref 13.0–17.0)
MCH: 33.7 pg (ref 26.0–34.0)
MCH: 32.3 pg (ref 26.0–34.0)
MCHC: 34.9 g/dL (ref 30.0–36.0)
MCHC: 32.3 g/dL (ref 30.0–36.0)
MCV: 96.8 fL (ref 80.0–100.0)
MCV: 100 fL (ref 80.0–100.0)
Platelets: 107 10*3/uL — ABNORMAL LOW (ref 150–400)
Platelets: 83 10*3/uL — ABNORMAL LOW (ref 150–400)
RBC: 2.49 MIL/uL — ABNORMAL LOW (ref 4.22–5.81)
RBC: 2.23 MIL/uL — AB (ref 4.22–5.81)
RDW: 13.6 % (ref 11.5–15.5)
RDW: 14 % (ref 11.5–15.5)
WBC: 14.6 10*3/uL — ABNORMAL HIGH (ref 4.0–10.5)
WBC: 12 10*3/uL — ABNORMAL HIGH (ref 4.0–10.5)
nRBC: 0.2 % (ref 0.0–0.2)
nRBC: 0.5 % — ABNORMAL HIGH (ref 0.0–0.2)

## 2018-04-05 LAB — HEMOGLOBIN AND HEMATOCRIT, BLOOD
HEMATOCRIT: 21.9 % — AB (ref 39.0–52.0)
Hemoglobin: 7.1 g/dL — ABNORMAL LOW (ref 13.0–17.0)

## 2018-04-05 LAB — POCT ACTIVATED CLOTTING TIME
ACTIVATED CLOTTING TIME: 246 s
ACTIVATED CLOTTING TIME: 230 s
Activated Clotting Time: 164 seconds
Activated Clotting Time: 169 seconds
Activated Clotting Time: 191 seconds
Activated Clotting Time: 208 seconds
Activated Clotting Time: 219 seconds
Activated Clotting Time: 219 seconds
Activated Clotting Time: 219 seconds
Activated Clotting Time: 224 seconds
Activated Clotting Time: 252 seconds
Activated Clotting Time: 263 seconds

## 2018-04-05 LAB — RENAL FUNCTION PANEL
ALBUMIN: 2 g/dL — AB (ref 3.5–5.0)
Albumin: 2 g/dL — ABNORMAL LOW (ref 3.5–5.0)
Anion gap: 27 — ABNORMAL HIGH (ref 5–15)
Anion gap: 22 — ABNORMAL HIGH (ref 5–15)
BUN: 55 mg/dL — AB (ref 8–23)
BUN: 44 mg/dL — ABNORMAL HIGH (ref 8–23)
CO2: 26 mmol/L (ref 22–32)
CO2: 19 mmol/L — ABNORMAL LOW (ref 22–32)
Calcium: 6 mg/dL — CL (ref 8.9–10.3)
Calcium: 6.4 mg/dL — CL (ref 8.9–10.3)
Chloride: 87 mmol/L — ABNORMAL LOW (ref 98–111)
Chloride: 97 mmol/L — ABNORMAL LOW (ref 98–111)
Creatinine, Ser: 3.78 mg/dL — ABNORMAL HIGH (ref 0.61–1.24)
Creatinine, Ser: 2.81 mg/dL — ABNORMAL HIGH (ref 0.61–1.24)
GFR calc Af Amer: 18 mL/min — ABNORMAL LOW (ref >60)
GFR calc Af Amer: 26 mL/min — ABNORMAL LOW (ref >60)
GFR calc non Af Amer: 15 mL/min — ABNORMAL LOW (ref >60)
GFR calc non Af Amer: 22 mL/min — ABNORMAL LOW (ref >60)
Glucose, Bld: 126 mg/dL — ABNORMAL HIGH (ref 70–99)
Glucose, Bld: 182 mg/dL — ABNORMAL HIGH (ref 70–99)
POTASSIUM: 4.5 mmol/L (ref 3.5–5.1)
Phosphorus: 3.6 mg/dL (ref 2.5–4.6)
Phosphorus: 4 mg/dL (ref 2.5–4.6)
Potassium: 4.4 mmol/L (ref 3.5–5.1)
Sodium: 140 mmol/L (ref 135–145)
Sodium: 138 mmol/L (ref 135–145)

## 2018-04-05 LAB — ECHOCARDIOGRAM COMPLETE
Height: 68 in
Weight: 2645.52 oz

## 2018-04-05 LAB — HEPARIN LEVEL (UNFRACTIONATED)
Heparin Unfractionated: 2.12 IU/mL — ABNORMAL HIGH (ref 0.30–0.70)
Heparin Unfractionated: 1.2 IU/mL — ABNORMAL HIGH (ref 0.30–0.70)

## 2018-04-05 LAB — GLUCOSE, CAPILLARY
Glucose-Capillary: 107 mg/dL — ABNORMAL HIGH (ref 70–99)
Glucose-Capillary: 113 mg/dL — ABNORMAL HIGH (ref 70–99)
Glucose-Capillary: 120 mg/dL — ABNORMAL HIGH (ref 70–99)
Glucose-Capillary: 127 mg/dL — ABNORMAL HIGH (ref 70–99)
Glucose-Capillary: 131 mg/dL — ABNORMAL HIGH (ref 70–99)
Glucose-Capillary: 133 mg/dL — ABNORMAL HIGH (ref 70–99)
Glucose-Capillary: 144 mg/dL — ABNORMAL HIGH (ref 70–99)

## 2018-04-05 LAB — BLOOD GAS, ARTERIAL
ACID-BASE EXCESS: 4.2 mmol/L — AB (ref 0.0–2.0)
Bicarbonate: 26.8 mmol/L (ref 20.0–28.0)
DRAWN BY: 441661
FIO2: 30
MECHVT: 430 mL
O2 Saturation: 99.3 %
PEEP: 5 cmH2O
Patient temperature: 99.7
RATE: 30 resp/min
pCO2 arterial: 31.8 mmHg — ABNORMAL LOW (ref 32.0–48.0)
pH, Arterial: 7.537 — ABNORMAL HIGH (ref 7.350–7.450)
pO2, Arterial: 144 mmHg — ABNORMAL HIGH (ref 83.0–108.0)

## 2018-04-05 LAB — OCCULT BLOOD X 1 CARD TO LAB, STOOL: Fecal Occult Bld: POSITIVE — AB

## 2018-04-05 LAB — PROTIME-INR
INR: 1.96
Prothrombin Time: 22.1 seconds — ABNORMAL HIGH (ref 11.4–15.2)

## 2018-04-05 LAB — LACTIC ACID, PLASMA: Lactic Acid, Venous: 4.1 mmol/L (ref 0.5–1.9)

## 2018-04-05 LAB — PHOSPHORUS: Phosphorus: 3.6 mg/dL (ref 2.5–4.6)

## 2018-04-05 LAB — MAGNESIUM: Magnesium: 1.6 mg/dL — ABNORMAL LOW (ref 1.7–2.4)

## 2018-04-05 LAB — PROCALCITONIN: Procalcitonin: 11.37 ng/mL

## 2018-04-05 LAB — TROPONIN I
Troponin I: 10.75 ng/mL (ref <0.03)
Troponin I: 10.26 ng/mL (ref <0.03)

## 2018-04-05 MED ORDER — FENTANYL BOLUS VIA INFUSION
25.0000 ug | INTRAVENOUS | Status: DC | PRN
  Filled 2018-04-05: qty 25

## 2018-04-05 MED ORDER — LACTATED RINGERS IV BOLUS
1000.0000 mL | Freq: Once | INTRAVENOUS | Status: AC
  Administered 2018-04-05: 1000 mL via INTRAVENOUS

## 2018-04-05 MED ORDER — MIDAZOLAM HCL 2 MG/2ML IJ SOLN: 1.0000 mg | INTRAMUSCULAR | Status: DC | PRN

## 2018-04-05 MED ORDER — CALCIUM GLUCONATE-NACL 2-0.675 GM/100ML-% IV SOLN
2.0000 g | Freq: Once | INTRAVENOUS | Status: AC
  Administered 2018-04-05: 2000 mg via INTRAVENOUS
  Filled 2018-04-05: qty 100

## 2018-04-05 MED ORDER — HEPARIN (PORCINE) 25000 UT/250ML-% IV SOLN: 500.0000 [IU]/h | INTRAVENOUS | Status: DC

## 2018-04-05 MED ORDER — SODIUM CHLORIDE 0.9 % IV SOLN
0.0000 ug/min | INTRAVENOUS | Status: DC
  Administered 2018-04-05: 240 ug/min via INTRAVENOUS
  Administered 2018-04-05: 250 ug/min via INTRAVENOUS
  Administered 2018-04-05: 200 ug/min via INTRAVENOUS
  Administered 2018-04-06: 240 ug/min via INTRAVENOUS
  Administered 2018-04-06: 50 ug/min via INTRAVENOUS
  Filled 2018-04-05 (×5): qty 8

## 2018-04-05 MED ORDER — FENTANYL CITRATE (PF) 100 MCG/2ML IJ SOLN: 50.0000 ug | Freq: Once | INTRAMUSCULAR | Status: DC

## 2018-04-05 MED ORDER — MIDAZOLAM HCL 2 MG/2ML IJ SOLN
1.0000 mg | INTRAMUSCULAR | Status: DC | PRN
  Administered 2018-04-11 – 2018-04-13 (×5): 1 mg via INTRAVENOUS
  Filled 2018-04-05 (×6): qty 2

## 2018-04-05 MED ORDER — FENTANYL CITRATE (PF) 100 MCG/2ML IJ SOLN
50.0000 ug | Freq: Once | INTRAMUSCULAR | Status: AC
  Administered 2018-04-05: 50 ug via INTRAVENOUS
  Filled 2018-04-05: qty 2

## 2018-04-05 MED ORDER — SENNOSIDES 8.8 MG/5ML PO SYRP: 5.0000 mL | ORAL_SOLUTION | Freq: Two times a day (BID) | ORAL | Status: DC | PRN

## 2018-04-05 MED ORDER — PANTOPRAZOLE SODIUM 40 MG IV SOLR
40.0000 mg | Freq: Two times a day (BID) | INTRAVENOUS | Status: DC
  Administered 2018-04-08 – 2018-04-10 (×5): 40 mg via INTRAVENOUS
  Filled 2018-04-05 (×5): qty 40

## 2018-04-05 MED ORDER — SODIUM CHLORIDE 0.9 % IV SOLN
80.0000 mg | Freq: Once | INTRAVENOUS | Status: AC
  Administered 2018-04-05: 80 mg via INTRAVENOUS
  Filled 2018-04-05: qty 80

## 2018-04-05 MED ORDER — FENTANYL 2500MCG IN NS 250ML (10MCG/ML) PREMIX INFUSION: 25.0000 ug/h | INTRAVENOUS | Status: DC

## 2018-04-05 MED ORDER — FENTANYL BOLUS VIA INFUSION
25.0000 ug | INTRAVENOUS | Status: DC | PRN
  Administered 2018-04-07: 25 ug via INTRAVENOUS
  Filled 2018-04-05: qty 25

## 2018-04-05 MED ORDER — FENTANYL 2500MCG IN NS 250ML (10MCG/ML) PREMIX INFUSION
25.0000 ug/h | INTRAVENOUS | Status: DC
  Administered 2018-04-05: 50 ug/h via INTRAVENOUS
  Administered 2018-04-07: 75 ug/h via INTRAVENOUS
  Administered 2018-04-08 – 2018-04-09 (×2): 100 ug/h via INTRAVENOUS
  Filled 2018-04-05 (×3): qty 250

## 2018-04-05 MED ORDER — SODIUM CHLORIDE 0.9 % IV SOLN
8.0000 mg/h | INTRAVENOUS | Status: AC
  Administered 2018-04-05 – 2018-04-08 (×8): 8 mg/h via INTRAVENOUS
  Filled 2018-04-05 (×11): qty 80

## 2018-04-05 MED ORDER — PRISMASOL BGK 4/2.5 32-4-2.5 MEQ/L REPLACEMENT SOLN
Status: DC
  Administered 2018-04-05 – 2018-04-08 (×6): via INTRAVENOUS_CENTRAL
  Filled 2018-04-05 (×9): qty 5000

## 2018-04-05 MED ORDER — PRISMASOL BGK 4/2.5 32-4-2.5 MEQ/L REPLACEMENT SOLN
Status: DC
  Administered 2018-04-05 – 2018-04-09 (×7): via INTRAVENOUS_CENTRAL
  Filled 2018-04-05 (×10): qty 5000

## 2018-04-05 NOTE — Progress Notes (Signed)
CRITICAL VALUE ALERT  Critical Value:  Calcium 6.0  Date & Time Notied:  04/05/18 0415  Provider Notified: Pola CornELink  Orders Received/Actions taken:

## 2018-04-05 NOTE — Progress Notes (Signed)
Pt had large unmeasured stool that appeared to be bloody, CCM MD Mannam aware Heparin gtt remains off  Started Protonix gtt   Sent Hemoccult to lab Awaiting results

## 2018-04-05 NOTE — Progress Notes (Signed)
Pt had second large unmeasured bloody stool today Hemoccult positive  GI consulted

## 2018-04-05 NOTE — Progress Notes (Addendum)
NAME:  Craig Mason, MRN:  [id number], DOB:  June 18, 1950, LOS: 1 ADMISSION DATE:  03/27/2018, CHIEF COMPLAINT:  Altered mental status  History of present illness   67 year old man, former smoker, with dementia, CVA, T2DM, history of respiratory failure 2/2 pneumonia with previous trach (decannulated now), GERD presenting with altered mental status. Lives at [facility]. Sister noted that his mental status had changed for the last week - less conversant and interactive.  He had acute worsening of mental status on day of admission with episode of emesis.At baseline he is conversant and oriented x 2 (gets confused about month).  Found to be in multiorgan failure.  Intubated at [facility] and transferred to [facility] for further management.    Past Medical History  former smoker, with dementia, history CVA, T2DM, history of respiratory failure 2/2 pneumonia with previous trach (decannulated now), GERD, osteoporosis  Significant Hospital Events   12/21 admission, vas cath placement and CRRT initiated  Consults:  Nephrology 12/21 Cardiology 12/22 GI 12/22  Procedures:  Rt IJ vas cath placement 12/21  Rt radial A line 12/20 > 12/21 Rt fem A line 12/21 > Lt fem VL 12.20  Significant Diagnostic Tests:  CT head w/o contrast on 12/20: No CT evidence for acute intracranial abnormality. Atrophy and mild small vessel ischemic changes of the white matter.  Echocardiogram 12/21-pending  CXR 12/22-mild bibasilar atelectasis, lines and tubes in good position.  I have reviewed the images personally.  Micro Data:  BCx 12/21 pending Resp Cx 12/21 pending  Antimicrobials:  Vanc 12/21> Cefepime 12/21>  Interim history/subjective:  Continues on the ventilator, CRRT Off Levophed overnight due to tachycardia..  Neo and vasopressin continues  Objective   Blood pressure 117/69, pulse 83, temperature 99.3 F (37.4 C), resp. rate (!) 28, height 5\' 8"  (1.727 m), weight 80.1 kg, SpO2 100  %. CVP:  [4 mmHg] 4 mmHg  Vent Mode: PSV;CPAP FiO2 (%):  [30 %-35 %] 30 % Set Rate:  [18 bmp-30 bmp] 18 bmp Vt Set:  [400 mL-430 mL] 430 mL PEEP:  [5 cmH20] 5 cmH20 Pressure Support:  [5 cmH20] 5 cmH20 Plateau Pressure:  [12 cmH20] 12 cmH20   Intake/Output Summary (Last 24 hours) at 04/05/2018 0741 Last data filed at 04/05/2018 0700 Gross per 24 hour  Intake 5774.16 ml  Output 2626 ml  Net 3148.16 ml   Filed Weights   04/12/2018 0055 04/01/2018 0500 04/05/18 0500  Weight: 75.1 kg 75 kg 80.1 kg    Examination: Gen:      Chronically ill-appearing HEENT:  EOMI, sclera anicteric, ET tube Neck:     No masses; no thyromegaly Lungs:    Clear to auscultation bilaterally; normal respiratory effort CV:         Regular rate and rhythm; no murmurs Abd:      + bowel sounds; soft, non-tender; no palpable masses, no distension Ext:    No edema; adequate peripheral perfusion Skin:      Warm and dry; no rash Neuro: sedated, unresponsive  Assessment & Plan:  Multiorgan failure Possible septic shock with lactic acidosis-unclear source of infection Continue pressors.  Wean off neo first.  Continue vasopressors Continue stress dose steroids Follow cultures.  Check procalcitonin  Elevated troponins, NSTEMI We will stop heparin drip as he has blood from NG tube Follow troponin levels Awaiting echocardiogram report Consult cardiology  GI bleed Hemoglobin dropped 2 points with blood from NG tube We will stop heparin drip. Start PPI drip, We have  consulted [facility]  Acute kidney injury with hyperkalemia and metabolic acidosis with increased anion gap:  Continue CRRT Bicarb adjusted as he is alkalotic now.  Diabetes, hyperglycemia SSI coverage  Elevated LFTs, ammonia Likely from shock liver.  Follow labs. Right upper quadrant ultrasound is unremarkable  Best practice:  Diet: NPO Pain/Anxiety/Delirium protocol (if indicated): Fentanyl gtt, versed prn VAP protocol (if indicated):  ordered DVT prophylaxis: SCDs GI prophylaxis: PPI drip Glucose control: SSI Mobility: Bed rest Code Status: DNR confirmed with sister.  Family Communication: Sister is healthcare POA. Updated her and brother in law at bedside.  Disposition: MICU  The patient is critically ill with multiple organ system failure and requires high complexity decision making for assessment and support, frequent evaluation and titration of therapies, advanced monitoring, review of radiographic studies and interpretation of complex data.   Critical Care Time devoted to patient care services, exclusive of separately billable procedures, described in this note is 35 minutes.   Chilton GreathousePraveen Kentley Blyden MD [facility] Pager [phone] If no answer or after 3pm call: [phone] 04/05/2018, 8:01 AM

## 2018-04-05 NOTE — Progress Notes (Signed)
Bright red blood noted suctioning from patient's OG tube. Pharmacy notified. STAT heparin level obtained.

## 2018-04-05 NOTE — Consult Note (Addendum)
Consultation  Referring Provider: CCM/ Dr Vaughan Browner Primary Care Physician:  [name], MD Primary Gastroenterologist:  none  Reason for Consultation:  GI bleed   HPI: Craig Mason is a 67 y.o. male who was admitted yesterday through the emergency room from skilled nursing facility with change in mental status and an episode of emesis.  Patient has history of dementia is fairly severe, history of CVA, adult onset diabetes mellitus, prior respiratory failure requiring trach placement. On admit found to have multiorgan failure.  He required intubation at [facility] and transferred [facility].  He is felt to have acute encephalopathy secondary to septic shock and lactic acidosis.  Patient also with acute kidney injury and elevated troponins. Patient has required maximal support with 3 pressors and CRRT was started last night.  He has been on IV heparin.  He has orogastric tube in place. Last night he was noted to have red blood from the orogastric tube, total of about 200 cc and has put out about 100 cc today.  Heparin was stopped this morning about 9:30 AM.  This afternoon he has now passed 2 fairly large grossly bloody bowel movements.  He has been relatively hemodynamically stable today still requiring 2 pressors. At 4 AM labs show WBC of 14.6, hemoglobin 8.4 hematocrit of 44.1 platelets 107.  Lactic acid 4.1 creatinine 3.7/BUN 55.--CBC pending currently  He has been started on IV Protonix infusion.  Patient sister is at bedside, siblings have power of attorney.  She is not aware that he has had any prior problems with GI bleeding, ulcer disease etc.    Prior to Admission medications   Medication Sig Start Date End Date Taking? Authorizing Provider  traMADol (ULTRAM) 50 MG tablet Place 50 mg into feeding tube every 6 (six) hours as needed. 10/17/17  Yes [provider]  atorvastatin (LIPITOR) 80 MG tablet Place 80 mg into feeding tube at bedtime.    [provider]    donepezil (ARICEPT) 10 MG tablet Place 10 mg into feeding tube at bedtime.    [provider]  folic acid (FOLVITE) 1 MG tablet Place 1 mg into feeding tube daily.    [provider]  insulin detemir (LEVEMIR) 100 UNIT/ML injection Inject 25 Units into the skin 2 (two) times daily.    [provider]  insulin glargine (LANTUS) 100 UNIT/ML injection Inject 25 Units into the skin 2 (two) times daily.    [provider]  metFORMIN (GLUCOPHAGE) 1000 MG tablet Place 1,000 mg into feeding tube 2 (two) times daily.    [provider]  traZODone (DESYREL) 100 MG tablet Place 100 mg into feeding tube at bedtime.    [provider]  venlafaxine (EFFEXOR) 75 MG tablet Place 75 mg into feeding tube every morning.    [provider]    Current Facility-Administered Medications  Medication Dose Route Frequency Provider Last Rate Last Dose  .  prismasol BGK 4/2.5 infusion   CRRT Continuous Roney Jaffe, MD 300 mL/hr at 04/05/18 0503    .  prismasol BGK 4/2.5 infusion   CRRT Continuous Roney Jaffe, MD 300 mL/hr at 04/05/18 0503    . 0.9 %  sodium chloride infusion (Manually program via Guardrails IV Fluids)   Intravenous Once Mannam, Praveen, MD      . 0.9 %  sodium chloride infusion  250 mL Intravenous Continuous Milagros Loll, MD   Stopped at 04/05/18 [id number]  . 0.9 %  sodium  chloride infusion   Intra-arterial PRN Jacques Earthly T, MD      . aspirin chewable tablet 81 mg  81 mg Per Tube Daily Milagros Loll, MD   Stopped at 04/05/18 1045  . atorvastatin (LIPITOR) tablet 80 mg  80 mg Per Tube q1800 Milagros Loll, MD      . bisacodyl (DULCOLAX) suppository 10 mg  10 mg Rectal Daily PRN Jacques Earthly T, MD      . ceFEPIme (MAXIPIME) 2 g in sodium chloride 0.9 % 100 mL IVPB  2 g Intravenous Q12H Scatliffe, Rise Paganini, MD   Stopped at 04/05/18 [phone]  . chlorhexidine gluconate (MEDLINE KIT) (PERIDEX) 0.12 % solution 15 mL  15 mL Mouth  Rinse BID Mannam, Praveen, MD   15 mL at 04/05/18 0738  . Chlorhexidine Gluconate Cloth 2 % PADS 6 each  6 each Topical Daily Milagros Loll, MD   6 each at 04/05/18 1425  . docusate (COLACE) 50 MG/5ML liquid 100 mg  100 mg Per Tube BID PRN Jacques Earthly T, MD      . donepezil (ARICEPT) tablet 10 mg  10 mg Oral QHS Milagros Loll, MD   10 mg at 04/01/2018 2113  . fentaNYL (SUBLIMAZE) bolus via infusion 25 mcg  25 mcg Intravenous Q1H PRN Mannam, Praveen, MD      . fentaNYL 2514mg in NS 2525m(1047mml) infusion-PREMIX  25-400 mcg/hr Intravenous Continuous Mannam, Praveen, MD 5 mL/hr at 04/05/18 1400 50 mcg/hr at 04/05/18 1400  . heparin 10,000 units/ 20 mL infusion syringe  250-3,000 Units/hr CRRT Continuous GolCorliss ParishD 0.5 mL/hr at 04/05/18 0403 250 Units/hr at 04/05/18 0403  . heparin bolus via infusion syringe 1,000 Units  1,000 Units CRRT PRN LedErenest BlankPH   1,000 Units at 04/10/2018 [id number] heparin injection 1,000-6,000 Units  1,000-6,000 Units CRRT PRN GolCorliss ParishD      . heparinized saline (2000 units/L) primer fluid for CRRT   CRRT PRN GolCorliss ParishD 999 mL/hr at 03/28/2018 0444    . hydrocortisone sodium succinate (SOLU-CORTEF) 100 MG injection 50 mg  50 mg Intravenous Q6H Mannam, Praveen, MD   50 mg at 04/05/18 1406  . insulin aspart (novoLOG) injection 0-15 Units  0-15 Units Subcutaneous Q4H Mannam, Praveen, MD   2 Units at 04/05/18 1300  . MEDLINE mouth rinse  15 mL Mouth Rinse 10 times per day Mannam, Praveen, MD   15 mL at 04/05/18 1425  . midazolam (VERSED) injection 1 mg  1 mg Intravenous Q15 min PRN Mannam, Praveen, MD      . midazolam (VERSED) injection 1 mg  1 mg Intravenous Q2H PRN Mannam, Praveen, MD      . pantoprazole (PROTONIX) 80 mg in sodium chloride 0.9 % 250 mL (0.32 mg/mL) infusion  8 mg/hr Intravenous Continuous Mannam, Praveen, MD 25 mL/hr at 04/05/18 1400 8 mg/hr at 04/05/18 1400  . [START ON 04/08/2018] pantoprazole  (PROTONIX) injection 40 mg  40 mg Intravenous Q12H Mannam, Praveen, MD      . phenylephrine (NEO-SYNEPHRINE) 80 mg in sodium chloride 0.9 % 500 mL (0.16 mg/mL) infusion  0-400 mcg/min Intravenous Titrated ManMarshell GarfinkelD   Stopped at 04/05/18 1129  . prismasol BGK 4/2.5 infusion   CRRT Continuous GolCorliss ParishD 1,500 mL/hr at 04/05/18 1428    . sennosides (SENOKOT) 8.8 MG/5ML syrup 5 mL  5 mL Per Tube BID PRN Mannam, Praveen, MD      . sodium  chloride flush (NS) 0.9 % injection 10-40 mL  10-40 mL Intracatheter Q12H Milagros Loll, MD   10 mL at 04/05/18 1100  . sodium chloride flush (NS) 0.9 % injection 10-40 mL  10-40 mL Intracatheter PRN Milagros Loll, MD      . vancomycin (VANCOCIN) IVPB 750 mg/150 ml premix  750 mg Intravenous Q24H Scatliffe, Rise Paganini, MD   Stopped at 03/25/2018 2015  . vasopressin (PITRESSIN) 40 Units in sodium chloride 0.9 % 250 mL (0.16 Units/mL) infusion  0.03 Units/min Intravenous Titrated Jacques Earthly T, MD 11.25 mL/hr at 04/05/18 1400 0.03 Units/min at 04/05/18 1400    Allergies as of 04/03/2018  . (Not on File)    No family history on file.  Social History   Socioeconomic History  . Marital status: Not on file    Spouse name: Not on file  . Number of children: Not on file  . Years of education: Not on file  . Highest education level: Not on file  Occupational History  . Not on file  Social Needs  . Financial resource strain: Not on file  . Food insecurity:    Worry: Not on file    Inability: Not on file  . Transportation needs:    Medical: Not on file    Non-medical: Not on file  Tobacco Use  . Smoking status: Not on file  Substance and Sexual Activity  . Alcohol use: Not on file  . Drug use: Not on file  . Sexual activity: Not on file  Lifestyle  . Physical activity:    Days per week: Not on file    Minutes per session: Not on file  . Stress: Not on file  Relationships  . Social connections:    Talks on phone: Not on  file    Gets together: Not on file    Attends religious service: Not on file    Active member of club or organization: Not on file    Attends meetings of clubs or organizations: Not on file    Relationship status: Not on file  . Intimate partner violence:    Fear of current or ex partner: Not on file    Emotionally abused: Not on file    Physically abused: Not on file    Forced sexual activity: Not on file  Other Topics Concern  . Not on file  Social History Narrative  . Not on file    Review of Systems: Gen: Denies any fever, chills, sweats, anorexia, fatigue, weakness, malaise, weight loss, and sleep disorder CV: Denies chest pain, angina, palpitations, syncope, orthopnea, PND, peripheral edema, and claudication. Resp: Denies dyspnea at rest, dyspnea with exercise, cough, sputum, wheezing, coughing up blood, and pleurisy. GI: Denies vomiting blood, jaundice, and fecal incontinence.   Denies dysphagia or odynophagia. GU : Denies urinary burning, blood in urine, urinary frequency, urinary hesitancy, nocturnal urination, and urinary incontinence. MS: Denies joint pain, limitation of movement, and swelling, stiffness, low back pain, extremity pain. Denies muscle weakness, cramps, atrophy.  Derm: Denies rash, itching, dry skin, hives, moles, warts, or unhealing ulcers.  Psych: Denies depression, anxiety, memory loss, suicidal ideation, hallucinations, paranoia, and confusion. Heme: Denies bruising, bleeding, and enlarged lymph nodes. Neuro:  Denies any headaches, dizziness, paresthesias. Endo:  Denies any problems with DM, thyroid, adrenal function.  Physical Exam: Vital signs in last 24 hours: Temp:  [98.1 F (36.7 C)-100.6 F (38.1 C)] 98.2 F (36.8 C) (12/22 1245) Pulse Rate:  [78-131] 81 (  12/22 1245) Resp:  [25-33] 29 (12/22 1245) BP: (114-182)/(49-119) 136/55 (12/22 1230) SpO2:  [89 %-100 %] 89 % (12/22 1245) Arterial Line BP: (110-153)/(38-58) 124/42 (12/22 1245) FiO2 (%):   [30 %] 30 % (12/22 1104) Weight:  [80.1 kg] 80.1 kg (12/22 0500) Last BM Date: 04/05/18 General:   Alert,  Well-developed, well-nourished, acute and chronically ill-appearing older white male, intubated, sedated.  Eyes open but not tracking  Head:  Normocephalic and atraumatic.  Orogastric tube with dark red blood Eyes:  Sclera clear, no icterus.   Conjunctiva pale. Ears:  Normal auditory acuity. Nose:  No deformity, discharge,  or lesions. Mouth:  No deformity or lesions.   Neck:  Supple; no masses or thyromegaly. Lungs: Breath sounds bilaterally tachypneic. Heart:  Regular rate and rhythm; no murmurs, clicks, rubs,  or gallops. Abdomen:  Soft,nontender, BS quiet no palpable mass or hepatosplenomegaly Rectal:  Deferred -Leslie bloody stool per ICU nurse Msk:  Symmetrical without gross deformities. . Pulses:  Normal pulses noted. Extremities:  Without clubbing or edema. Neurologic: Intubated and sedated. Skin:  Intact without significant lesions or rashes.. Psych:    Intake/Output from previous day: 12/21 0701 - 12/22 0700 In: 5774.2 [I.V.:5035.8; NG/GT:40; IV Piggyback:698.3] Out: 2626 [Urine:96; Emesis/NG output:500; Stool:50] Intake/Output this shift: Total I/O In: 1871.7 [I.V.:735.7; IV Piggyback:1136] Out: 119 [Urine:65; Other:666]  Lab Results: Recent Labs    03/22/2018 0249 03/28/2018 1050 04/07/2018 1100 04/05/18 0412  WBC 21.0*  --  25.7* 14.6*  HGB 11.3* 10.2* 10.7* 8.4*  HCT 38.8* 30.0* 34.6* 24.1*  PLT 198  --  160 107*   BMET Recent Labs    03/29/2018 0830 04/02/2018 1050 03/22/2018 1600 04/05/18 0315  NA 145 141 142 140  140  K 6.1* 5.5* 4.9 4.3  4.4  CL 102 105 96* 87*  87*  CO2 10*  --  16* 23  26  GLUCOSE 280* 220* 124* 127*  126*  BUN 88* 81* 69* 56*  55*  CREATININE 6.22* 5.40* 4.90* 3.77*  3.78*  CALCIUM 6.5*  --  6.0* 5.9*  6.0*   LFT Recent Labs    04/05/18 0315  PROT 4.3*  ALBUMIN 2.1*  2.0*  AST 141*  ALT 86*  ALKPHOS 92  BILITOT  2.1*   PT/INR Recent Labs    03/21/2018 0145 04/09/2018 0830  LABPROT 17.7* 17.0*  INR 1.48 1.40   Hepatitis Panel No results for input(s): HEPBSAG, HCVAB, HEPAIGM, HEPBIGM in the last 72 hours.    IMPRESSION:  #45 67 year old white male with acute GI bleed in setting of critical illness, multi-organ failure presumed septic shock, IV heparin and orogastric tube placement.  Etiology of bleeding not clear, rule out ischemic gastropathy, rule out esophageal or gastric ulcer, rule out NG trauma. Rule out occult gastric lesion  #2 if again dementia #3 chronic kidney disease stage III-with acute kidney injury/renal failure requiring CRRT #4 prior CVA #5.  Adult onset diabetes mellitus #6 anemia multifactorial.  Patient has had 2 g drop since yesterday, baseline hemoglobin between 11 and 12   Plan; Heparin has been off over the past 5 hours, Leave off anticoagulation as long as possible. IV PPI infusion has been started Await pending hemoglobin. Check PT.Jeannie Done Patient's sister is at bedside and situation was discussed with her in detail.  If he has further evidence of active bleeding today and further drop in hemoglobin, bedside EGD can be done per Dr. Tarri Glenn. If hemoglobin stable, will continue to support, follow serial hemoglobins  every 6 hours, and transfuse as needed. Thank you will follow with you.    Twylia Oka PA-C 04/05/2018, 2:49 PM

## 2018-04-05 NOTE — Progress Notes (Addendum)
eLink Physician-Brief Progress Note Patient Name: Craig Mason DOB: [birth year] MRN: [id number]   Date of Service  04/05/2018  HPI/Events of Note  Hypocalcemia PH of 7.5 on bicarb gtt but CO2 of 26 and PCO2 of 32  eICU Interventions  Plan: Calcium replaced Reduce RR to 18 Nurse to Contact Nephrology regarding bicarb based dialysate     Intervention Category Major Interventions: Acid-Base disturbance - evaluation and management Intermediate Interventions: Electrolyte abnormality - evaluation and management  Cindia Hustead 04/05/2018, 4:29 AM

## 2018-04-05 NOTE — Progress Notes (Signed)
ANTICOAGULATION CONSULT NOTE Pharmacy Consult for heparin  Indication: VTE treatment   Patient Measurements: Height: 5\' 8"  (172.7 cm) Weight: 165 lb 5.5 oz (75 kg) IBW/kg (Calculated) : 68.4 Heparin Dosing Weight: 70kg  Vital Signs: Temp: 99.9 F (37.7 C) (12/22 0000) BP: 156/58 (12/22 0000) Pulse Rate: 105 (12/22 0000)  Labs: Recent Labs    10/31/17 0145  10/31/17 0249 10/31/17 0830 10/31/17 1050 10/31/17 1100 10/31/17 1513 10/31/17 1600 10/31/17 1700 10/31/17 2028 04/05/18 0023  HGB 11.2*  --  11.3*  --  10.2* 10.7*  --   --   --   --   --   HCT 39.2  --  38.8*  --  30.0* 34.6*  --   --   --   --   --   PLT 273  --  198  --   --  160  --   --   --   --   --   APTT 29  --   --  64*  --   --   --   --   --   --   --   LABPROT 17.7*  --   --  17.0*  --   --   --   --   --   --   --   INR 1.48  --   --  1.40  --   --   --   --   --   --   --   HEPARINUNFRC  --   --   --   --   --   --   --   --  1.68*  --  2.12*  CREATININE 7.68*  --  7.15* 6.22* 5.40*  --   --  4.90*  --   --   --   TROPONINI  --    < > 1.06* 1.50*  --   --  4.78*  --   --  9.41*  --    < > = values in this interval not displayed.     Assessment: 67 y.o. male with possible PE/DVT for heparin.  Currently on CRRT and receiving heparin via dialysis machine  Goal of Therapy:  Heparin level 0.3-0.7 units/ml Monitor platelets by anticoagulation protocol: Yes   Plan:  Hold heparin until 0300, then decrease heparin 500 units/hr Check heparin level in 8 hours.   Izaiah Candlebbott, Maytte Jacot Vernon 04/05/2018 1:12 AM

## 2018-04-05 NOTE — Progress Notes (Signed)
Subjective:  Remains critically ill but maybe some dec in pressors- pre and post filter fluids changed to all dialysate as acidosis is better - no meaningful UOP- clinically GIB now  Objective Vital signs in last 24 hours: Vitals:   04/05/18 0600 04/05/18 0700 04/05/18 0712 04/05/18 0715  BP: (!) 161/67 117/69    Pulse: (!) 103 (!) 123 83 83  Resp: (!) 28 (!) 25 (!) 28 (!) 28  Temp: 99.7 F (37.6 C) 99.3 F (37.4 C)  99.3 F (37.4 C)  TempSrc:      SpO2: 97% 100% 100% 100%  Weight:      Height:       Weight change: 5 kg  Intake/Output Summary (Last 24 hours) at 04/05/2018 0818 Last data filed at 04/05/2018 0800 Gross per 24 hour  Intake 5967.3 ml  Output 2687 ml  Net 3280.3 ml   Assessment/Plan: 67 year old but chronically ill skilled nursing facility resident presenting with hypothermia, hypotension, acute kidney injury with severe metabolic acidosis, lactate of 12 with hyperkalemia 1.Renal-presumed acute kidney injury-no prior data known as far as kidney function.  Severe hypotension possibly sepsis-requiring a great deal of support. CRRT initiated 12/21 with good lab results- using all 4 K dialysate  2. Hypertension/volume  -severely hypotensive requiring pressor support.  positive 3 liters over yesterday  Currently running even but with permission to give extra fluid 3.  Hyperkalemia and metabolic acidosis-related, elevated lactate.  Supportive care.  has improved, lactate down to 4 4. Anemia  -dropped acutely from 10-8 - am using heparin with CRRT but very minimal amount, would like to continue- was also on systemic heparin which has been turned off- supportive care 5. Elytes- K- Na -phos good.   Calcium low- corrected is in the high 7's- given IV calcium this AM 6.  Prognosis appears poor to me- is DNR    Craig Mason    Labs: Basic Metabolic Panel: Recent Labs  Lab 03/24/2018 0830 03/28/2018 1050 04/01/2018 1600 04/05/18 0315  NA 145 141 142 140  140  K  6.1* 5.5* 4.9 4.3  4.4  CL 102 105 96* 87*  87*  CO2 10*  --  16* 23  26  GLUCOSE 280* 220* 124* 127*  126*  BUN 88* 81* 69* 56*  55*  CREATININE 6.22* 5.40* 4.90* 3.77*  3.78*  CALCIUM 6.5*  --  6.0* 5.9*  6.0*  PHOS 11.2*  --  5.3* 3.6  3.6   Liver Function Tests: Recent Labs  Lab 04/02/2018 0145 03/22/2018 0830 03/22/2018 1600 04/05/18 0315  AST 258* 232*  --  141*  ALT 123* 118*  --  86*  ALKPHOS 129* 124  --  92  BILITOT 1.8* 2.3*  --  2.1*  PROT 4.8* 4.4*  --  4.3*  ALBUMIN 2.2* 2.2* 2.1* 2.1*  2.0*   No results for input(s): LIPASE, AMYLASE in the last 168 hours. Recent Labs  Lab 04/09/2018 0430 03/16/2018 1709  AMMONIA 63* 29   CBC: Recent Labs  Lab 04/03/2018 0145 03/24/2018 0249 03/31/2018 1050 03/29/2018 1100 04/05/18 0412  WBC 27.6* 21.0*  --  25.7* 14.6*  NEUTROABS 21.5*  --   --   --   --   HGB 11.2* 11.3* 10.2* 10.7* 8.4*  HCT 39.2 38.8* 30.0* 34.6* 24.1*  MCV 114.0* 111.8*  --  104.2* 96.8  PLT 273 198  --  160 107*   Cardiac Enzymes: Recent Labs  Lab 03/30/2018 0249 04/11/2018 0830 03/20/2018 1513  03/29/2018 2028 04/05/18 0315  TROPONINI 1.06* 1.50* 4.78* 9.41* 10.75*   CBG: Recent Labs  Lab 03/26/2018 1133 04/05/2018 1600 03/22/2018 2018 03/20/2018 2358 04/05/18 0318  GLUCAP 161* 110* 124* 131* 120*    Iron Studies: No results for input(s): IRON, TIBC, TRANSFERRIN, FERRITIN in the last 72 hours. Studies/Results: US Abdomen Complete  Result Date: 03/15/2018 CLINICAL DATA:  Elevated LFTs, acute kidney injury; on ventilator, history diabetes mellitus EXAM: ABDOMEN ULTRASOUND COMPLETE COMPARISON:  None FINDINGS: Gallbladder: Multiple irregular echogenic foci within gallbladder lumen, without definite shadowing, question polyps versus nonshadowing calculi. Patient was not mobile, on ventilator, limiting assessment of motion. Mild gallbladder wall thickening. Unable to assess for Percell Miller sign due to patient condition on ventilator. No pericholecystic fluid.  Common bile duct: Diameter: 4 mm diameter Liver: Echogenic parenchyma, likely fatty infiltration though this can be seen with cirrhosis and certain infiltrative disorders. No focal hepatic mass or nodularity. Portal vein patent with normal direction of blood flow towards the liver. IVC: Obscured by bowel gas Pancreas: Obscured by bowel gas Spleen: Not visualized, likely due to limited patient positioning Right Kidney: Length: 11.7 cm. Cortical thinning. Increased cortical echogenicity. No mass or hydronephrosis. Left Kidney: Length: 12.9 cm. Cortical thinning period increased cortical echogenicity. No mass or hydronephrosis. Abdominal aorta: Predominately obscured due to bowel gas. RIGHT pleural effusion noted. Perihepatic ascites noted. Other findings: None. IMPRESSION: Limited exam due to patient condition. Nonshadowing echogenic foci within gallbladder question polyps up to 10 mm diameter versus nonshadowing calculi. Probable fatty infiltration of liver. Medical renal disease changes of both kidneys without gross mass or hydronephrosis. Inadequate visualization of IVC, pancreas, spleen and aorta. Electronically Signed   By: Lavonia Dana M.D.   On: 04/07/2018 15:06   Dg Chest Port 1 View  Result Date: 04/05/2018 CLINICAL DATA:  Acute respiratory failure. EXAM: PORTABLE CHEST 1 VIEW COMPARISON:  03/30/2018 FINDINGS: Endotracheal tube has tip 6.1 cm above the carina. Nasogastric tube has tip and side-port over the stomach in the left upper quadrant. Right IJ central venous catheter has tip over the SVC and is unchanged. Lungs are adequately inflated with subtle linear bibasilar density likely atelectasis. No effusion. Cardiomediastinal silhouette and remainder of the exam is unchanged. IMPRESSION: Mild bibasilar linear density unchanged likely atelectasis. Tubes and lines as described. Electronically Signed   By: Marin Olp M.D.   On: 04/05/2018 07:13   Dg Chest Port 1 View  Result Date:  03/29/2018 CLINICAL DATA:  Central line placement. EXAM: PORTABLE CHEST 1 VIEW COMPARISON:  Earlier this morning at 0100 hours. FINDINGS: 0231 hours. Endotracheal tube terminates 5.2 cm above carina. Nasogastric tube terminates at the body of stomach. Right internal jugular line terminates at the low SVC. Borderline cardiomegaly. Atherosclerosis in the transverse aorta. No pleural effusion or pneumothorax. The right costophrenic angle is minimally excluded. Bibasilar atelectasis is worse on the left. IMPRESSION: 1. Appropriate position right internal jugular line, without pneumothorax. 2. Bibasilar atelectasis. 3.  Aortic Atherosclerosis (ICD10-I70.0). Electronically Signed   By: Abigail Miyamoto M.D.   On: 03/19/2018 02:43   Dg Chest Port 1 View  Result Date: 03/23/2018 CLINICAL DATA:  ET and central line plmt EXAM: PORTABLE CHEST 1 VIEW COMPARISON:  None. FINDINGS: Endotracheal tube terminates 5.1 cm above carina. Nasogastric tube terminates at the body of the stomach. Midline trachea. Normal heart size. Atherosclerosis in the transverse aorta. No pleural effusion or pneumothorax. Mild thickening of the right minor fissure. Subsegmental atelectasis or scarring at the left lung base. IMPRESSION:  1. Appropriate position of endotracheal and nasogastric tubes. 2. No acute cardiopulmonary disease. 3.  Aortic Atherosclerosis (ICD10-I70.0). Electronically Signed   By: Abigail Miyamoto M.D.   On: 03/31/2018 01:24   US Liver Doppler  Result Date: 03/18/2018 CLINICAL DATA:  Elevated LFTs EXAM: DUPLEX ULTRASOUND OF LIVER TECHNIQUE: Color and duplex Doppler ultrasound was performed to evaluate the hepatic in-flow and out-flow vessels. COMPARISON:  04/04/2008 FINDINGS: Liver: Mild increased echogenicity. Grossly normal hepatic contour. Small amount of perihepatic ascites. Slight intrahepatic biliary prominence without definite obstruction pattern. No focal lesion, mass or intrahepatic biliary ductal dilatation. Portal Vein  Velocities Main:  40 cm/sec Right:  15 cm/sec Left:  25 cm/sec Hepatic Vein Velocities Right:  21 cm/sec Middle:  27 cm/sec Left:  Not visualized IVC: Present and patent with normal respiratory phasicity. Hepatic Artery Velocity:  Not visualized Splenic Vein Velocity:  Not visualized Varices: Not visualized Ascites: Small amount of abdominal ascites mild gallbladder wall thickening measuring 4.4 mm, suspect related to chronic hepatic disease. Small gallbladder polyps noted. Trace perihepatic ascites. Small gallstones. Patent portal vein with normal hepatopetal flow. Right and middle hepatic veins are patent. Left hepatic vein and splenic vein not visualized well because of body habitus and ventilatory support. This limits the exam. No gross veno-occlusive process. IMPRESSION: Limited exam but patent portal vein with normal directional flow. Other findings as above. Electronically Signed   By: Jerilynn Mages.  Shick M.D.   On: 04/10/2018 14:32   Medications: Infusions: .  prismasol BGK 4/2.5 300 mL/hr at 04/05/18 0503  .  prismasol BGK 4/2.5 300 mL/hr at 04/05/18 0503  . sodium chloride Stopped (04/05/18 0625)  . sodium chloride    . ceFEPime (MAXIPIME) IV Stopped (04/01/2018 2056)  . fentaNYL infusion INTRAVENOUS    . heparin 10,000 units/ 20 mL infusion syringe 250 Units/hr (04/05/18 0403)  . heparin Stopped (04/05/18 0754)  . heparin 999 mL/hr at 03/20/2018 0444  . pantoprazole (PROTONIX) IVPB    . pantoprozole (PROTONIX) infusion    . phenylephrine 41m/250mL (0.157mmL) infusion 300 mcg/min (04/05/18 0803)  . prismasol BGK 4/2.5 1,500 mL/hr at 04/05/18 0741  . vancomycin Stopped (04/05/2018 2015)  . vasopressin (PITRESSIN) infusion - *FOR SHOCK* 0.03 Units/min (04/05/18 0800)    Scheduled Medications: . sodium chloride   Intravenous Once  . aspirin  81 mg Per Tube Daily  . atorvastatin  80 mg Per Tube q1800  . chlorhexidine gluconate (MEDLINE KIT)  15 mL Mouth Rinse BID  . Chlorhexidine Gluconate Cloth  6  each Topical Daily  . donepezil  10 mg Oral QHS  . fentaNYL (SUBLIMAZE) injection  50 mcg Intravenous Once  . hydrocortisone sod succinate (SOLU-CORTEF) inj  50 mg Intravenous Q6H  . insulin aspart  0-15 Units Subcutaneous Q4H  . mouth rinse  15 mL Mouth Rinse 10 times per day  . [START ON 04/08/2018] pantoprazole  40 mg Intravenous Q12H  . sodium chloride flush  10-40 mL Intracatheter Q12H    have reviewed scheduled and prn medications.  Physical Exam: General: sedated, intubated Heart: RRR Lungs: CBS bilat Abdomen: distended Extremities: no edema- large amount of dark stool  Dialysis Access: right IJ vascath placed 12/21    04/05/2018,8:18 AM  LOS: 1 day

## 2018-04-05 NOTE — Consult Note (Signed)
Cardiology Consultation:   Patient ID: Tag Matsumura MRN: [id number]; DOB: 02/25/1951  Admit date: 03/18/2018 Date of Consult: 04/05/2018  Primary Care Provider: Haque, Imran P, MD Primary Cardiologist: New/ Primary Electrophysiologist:  None     Patient Profile:   Craig Mason is a 67 y.o. male with a hx of smoking, dementia, CVA, DM-2 respiratory failure with previous tracheostomy admitted yesterday with change in mental status  who is being seen today for the evaluation of elevated troponin  at the request of Manem .  History of Present Illness:   Mr. Cunliffe 67 y.o. admitted with change in MS. Hypotensive Rx NS bolus and pressors. ? Sepsis.  No cardiac history Cr over 7 now on CRRT This am appears to have a large GI bleed Was put on heparin for ? PE now stopped. TTE done yesterday with only mild RV enlargement no significant Valve disease and normal EF with no RWMA;s or pericardial effusion. Patient gives no history But No previous history of CAD/MI or CHF. Had not complained of chest pain prior to admission   No past medical history on file.     Home Medications:  Prior to Admission medications   Medication Sig Start Date End Date Taking? Authorizing Provider  traMADol (ULTRAM) 50 MG tablet Place 50 mg into feeding tube every 6 (six) hours as needed. 10/17/17  Yes [provider]  atorvastatin (LIPITOR) 80 MG tablet Place 80 mg into feeding tube at bedtime.    [provider]  donepezil (ARICEPT) 10 MG tablet Place 10 mg into feeding tube at bedtime.    [provider]  folic acid (FOLVITE) 1 MG tablet Place 1 mg into feeding tube daily.    [provider]  insulin detemir (LEVEMIR) 100 UNIT/ML injection Inject 25 Units into the skin 2 (two) times daily.    [provider]  insulin glargine (LANTUS) 100 UNIT/ML injection Inject 25 Units into the skin 2 (two) times daily.    [provider]  metFORMIN (GLUCOPHAGE)  1000 MG tablet Place 1,000 mg into feeding tube 2 (two) times daily.    [provider]  traZODone (DESYREL) 100 MG tablet Place 100 mg into feeding tube at bedtime.    [provider]  venlafaxine (EFFEXOR) 75 MG tablet Place 75 mg into feeding tube every morning.    [provider]    Inpatient Medications: Scheduled Meds: . sodium chloride   Intravenous Once  . aspirin  81 mg Per Tube Daily  . atorvastatin  80 mg Per Tube q1800  . chlorhexidine gluconate (MEDLINE KIT)  15 mL Mouth Rinse BID  . Chlorhexidine Gluconate Cloth  6 each Topical Daily  . donepezil  10 mg Oral QHS  . fentaNYL (SUBLIMAZE) injection  50 mcg Intravenous Once  . hydrocortisone sod succinate (SOLU-CORTEF) inj  50 mg Intravenous Q6H  . insulin aspart  0-15 Units Subcutaneous Q4H  . mouth rinse  15 mL Mouth Rinse 10 times per day  . [START ON 04/08/2018] pantoprazole  40 mg Intravenous Q12H  . sodium chloride flush  10-40 mL Intracatheter Q12H   Continuous Infusions: .  prismasol BGK 4/2.5 300 mL/hr at 04/05/18 0503  .  prismasol BGK 4/2.5 300 mL/hr at 04/05/18 0503  . sodium chloride Stopped (04/05/18 0625)  . sodium chloride    . ceFEPime (MAXIPIME) IV 2 g (04/05/18 0824)  . fentaNYL infusion INTRAVENOUS    . heparin 10,000 units/ 20 mL infusion syringe 250 Units/hr (04/05/18 0403)  .   heparin Stopped (04/05/18 0754)  . heparin 999 mL/hr at 03/21/2018 0444  . pantoprazole (PROTONIX) IVPB    . pantoprozole (PROTONIX) infusion    . phenylephrine 40mg/250mL (0.16mg/mL) infusion 300 mcg/min (04/05/18 0803)  . prismasol BGK 4/2.5 1,500 mL/hr at 04/05/18 0741  . vancomycin Stopped (04/02/2018 2015)  . vasopressin (PITRESSIN) infusion - *FOR SHOCK* 0.03 Units/min (04/05/18 0800)   PRN Meds: Place/Maintain arterial line **AND** sodium chloride, bisacodyl, docusate, fentaNYL, heparin, heparin, heparin, midazolam, midazolam, sennosides, sodium chloride flush  Allergies:   No Known  Allergies  Social History:   Social History   Socioeconomic History  . Marital status: Not on file    Spouse name: Not on file  . Number of children: Not on file  . Years of education: Not on file  . Highest education level: Not on file  Occupational History  . Not on file  Social Needs  . Financial resource strain: Not on file  . Food insecurity:    Worry: Not on file    Inability: Not on file  . Transportation needs:    Medical: Not on file    Non-medical: Not on file  Tobacco Use  . Smoking status: Not on file  Substance and Sexual Activity  . Alcohol use: Not on file  . Drug use: Not on file  . Sexual activity: Not on file  Lifestyle  . Physical activity:    Days per week: Not on file    Minutes per session: Not on file  . Stress: Not on file  Relationships  . Social connections:    Talks on phone: Not on file    Gets together: Not on file    Attends religious service: Not on file    Active member of club or organization: Not on file    Attends meetings of clubs or organizations: Not on file    Relationship status: Not on file  . Intimate partner violence:    Fear of current or ex partner: Not on file    Emotionally abused: Not on file    Physically abused: Not on file    Forced sexual activity: Not on file  Other Topics Concern  . Not on file  Social History Narrative  . Not on file    Family History:   Not available from patient or old records    ROS:  Please see the history of present illness.   All other ROS reviewed and negative.     Physical Exam/Data:   Vitals:   04/05/18 0600 04/05/18 0700 04/05/18 0712 04/05/18 0715  BP: (!) 161/67 117/69    Pulse: (!) 103 (!) 123 83 83  Resp: (!) 28 (!) 25 (!) 28 (!) 28  Temp: 99.7 F (37.6 C) 99.3 F (37.4 C)  99.3 F (37.4 C)  TempSrc:      SpO2: 97% 100% 100% 100%  Weight:      Height:        Intake/Output Summary (Last 24 hours) at 04/05/2018 0834 Last data filed at 04/05/2018 0800 Gross  per 24 hour  Intake 5967.3 ml  Output 2687 ml  Net 3280.3 ml   Filed Weights   04/07/2018 0055 03/29/2018 0500 04/05/18 0500  Weight: 75.1 kg 75 kg 80.1 kg   Body mass index is 26.85 kg/m.   Not communicative  [place] chronically ill white male  HEENT: CRRT catheter right IJ Neck supple with no adenopathy JVP normal no bruits no thyromegaly Lungs basilar rhonchi old trach   scar  Heart:  S1/S2 no murmur, no rub, gallop or click PMI normal Abdomen: benighn, BS positve, no tenderness, no AAA no bruit.  No HSM or HJR Distal pulses intact with no bruits No edema Neuro non-focal Skin warm and dry No muscular weakness   EKG:  SR RBBB no acute changes  Telemetry:  Telemetry was personally reviewed and demonstrates:  SR rates in 80's   Relevant CV Studies: TTE EF 55-60% mild RV enlargement no valve disease   Laboratory Data:  Chemistry Recent Labs  Lab 03/18/2018 0830 04/11/2018 1050 03/28/2018 1600 04/05/18 0315  NA 145 141 142 140  140  K 6.1* 5.5* 4.9 4.3  4.4  CL 102 105 96* 87*  87*  CO2 10*  --  16* 23  26  GLUCOSE 280* 220* 124* 127*  126*  BUN 88* 81* 69* 56*  55*  CREATININE 6.22* 5.40* 4.90* 3.77*  3.78*  CALCIUM 6.5*  --  6.0* 5.9*  6.0*  GFRNONAA 8*  --  11* 15*  15*  GFRAA 10*  --  13* 18*  18*  ANIONGAP 33*  --  30* 30*  27*    Recent Labs  Lab 03/16/2018 0145 03/29/2018 0830 03/25/2018 1600 04/05/18 0315  PROT 4.8* 4.4*  --  4.3*  ALBUMIN 2.2* 2.2* 2.1* 2.1*  2.0*  AST 258* 232*  --  141*  ALT 123* 118*  --  86*  ALKPHOS 129* 124  --  92  BILITOT 1.8* 2.3*  --  2.1*   Hematology Recent Labs  Lab 03/24/2018 0249 04/08/2018 1050 04/12/2018 1100 04/05/18 0412  WBC 21.0*  --  25.7* 14.6*  RBC 3.47*  --  3.32* 2.49*  HGB 11.3* 10.2* 10.7* 8.4*  HCT 38.8* 30.0* 34.6* 24.1*  MCV 111.8*  --  104.2* 96.8  MCH 32.6  --  32.2 33.7  MCHC 29.1*  --  30.9 34.9  RDW 13.1  --  13.2 13.6  PLT 198  --  160 107*   Cardiac Enzymes Recent Labs  Lab  03/30/2018 0249 03/18/2018 0830 03/23/2018 1513 03/23/2018 2028 04/05/18 0315  TROPONINI 1.06* 1.50* 4.78* 9.41* 10.75*   No results for input(s): TROPIPOC in the last 168 hours.  BNPNo results for input(s): BNP, PROBNP in the last 168 hours.  DDimer No results for input(s): DDIMER in the last 168 hours.  Radiology/Studies:  US Abdomen Complete  Result Date: 03/16/2018 CLINICAL DATA:  Elevated LFTs, acute kidney injury; on ventilator, history diabetes mellitus EXAM: ABDOMEN ULTRASOUND COMPLETE COMPARISON:  None FINDINGS: Gallbladder: Multiple irregular echogenic foci within gallbladder lumen, without definite shadowing, question polyps versus nonshadowing calculi. Patient was not mobile, on ventilator, limiting assessment of motion. Mild gallbladder wall thickening. Unable to assess for Percell Miller sign due to patient condition on ventilator. No pericholecystic fluid. Common bile duct: Diameter: 4 mm diameter Liver: Echogenic parenchyma, likely fatty infiltration though this can be seen with cirrhosis and certain infiltrative disorders. No focal hepatic mass or nodularity. Portal vein patent with normal direction of blood flow towards the liver. IVC: Obscured by bowel gas Pancreas: Obscured by bowel gas Spleen: Not visualized, likely due to limited patient positioning Right Kidney: Length: 11.7 cm. Cortical thinning. Increased cortical echogenicity. No mass or hydronephrosis. Left Kidney: Length: 12.9 cm. Cortical thinning period increased cortical echogenicity. No mass or hydronephrosis. Abdominal aorta: Predominately obscured due to bowel gas. RIGHT pleural effusion noted. Perihepatic ascites noted. Other findings: None. IMPRESSION: Limited exam due to patient condition. Nonshadowing echogenic  foci within gallbladder question polyps up to 10 mm diameter versus nonshadowing calculi. Probable fatty infiltration of liver. Medical renal disease changes of both kidneys without gross mass or hydronephrosis. Inadequate  visualization of IVC, pancreas, spleen and aorta. Electronically Signed   By: Mark  Boles M.D.   On: 03/23/2018 15:06   Dg Chest Port 1 View  Result Date: 04/05/2018 CLINICAL DATA:  Acute respiratory failure. EXAM: PORTABLE CHEST 1 VIEW COMPARISON:  04/07/2018 FINDINGS: Endotracheal tube has tip 6.1 cm above the carina. Nasogastric tube has tip and side-port over the stomach in the left upper quadrant. Right IJ central venous catheter has tip over the SVC and is unchanged. Lungs are adequately inflated with subtle linear bibasilar density likely atelectasis. No effusion. Cardiomediastinal silhouette and remainder of the exam is unchanged. IMPRESSION: Mild bibasilar linear density unchanged likely atelectasis. Tubes and lines as described. Electronically Signed   By: Daniel  Boyle M.D.   On: 04/05/2018 07:13   Dg Chest Port 1 View  Result Date: 03/25/2018 CLINICAL DATA:  Central line placement. EXAM: PORTABLE CHEST 1 VIEW COMPARISON:  Earlier this morning at 0100 hours. FINDINGS: 0231 hours. Endotracheal tube terminates 5.2 cm above carina. Nasogastric tube terminates at the body of stomach. Right internal jugular line terminates at the low SVC. Borderline cardiomegaly. Atherosclerosis in the transverse aorta. No pleural effusion or pneumothorax. The right costophrenic angle is minimally excluded. Bibasilar atelectasis is worse on the left. IMPRESSION: 1. Appropriate position right internal jugular line, without pneumothorax. 2. Bibasilar atelectasis. 3.  Aortic Atherosclerosis (ICD10-I70.0). Electronically Signed   By: Kyle  Talbot M.D.   On: 04/03/2018 02:43   Dg Chest Port 1 View  Result Date: 03/20/2018 CLINICAL DATA:  ET and central line plmt EXAM: PORTABLE CHEST 1 VIEW COMPARISON:  None. FINDINGS: Endotracheal tube terminates 5.1 cm above carina. Nasogastric tube terminates at the body of the stomach. Midline trachea. Normal heart size. Atherosclerosis in the transverse aorta. No pleural effusion  or pneumothorax. Mild thickening of the right minor fissure. Subsegmental atelectasis or scarring at the left lung base. IMPRESSION: 1. Appropriate position of endotracheal and nasogastric tubes. 2. No acute cardiopulmonary disease. 3.  Aortic Atherosclerosis (ICD10-I70.0). Electronically Signed   By: Kyle  Talbot M.D.   On: 04/14/2018 01:24   Us Liver Doppler  Result Date: 04/07/2018 CLINICAL DATA:  Elevated LFTs EXAM: DUPLEX ULTRASOUND OF LIVER TECHNIQUE: Color and duplex Doppler ultrasound was performed to evaluate the hepatic in-flow and out-flow vessels. COMPARISON:  04/04/2008 FINDINGS: Liver: Mild increased echogenicity. Grossly normal hepatic contour. Small amount of perihepatic ascites. Slight intrahepatic biliary prominence without definite obstruction pattern. No focal lesion, mass or intrahepatic biliary ductal dilatation. Portal Vein Velocities Main:  40 cm/sec Right:  15 cm/sec Left:  25 cm/sec Hepatic Vein Velocities Right:  21 cm/sec Middle:  27 cm/sec Left:  Not visualized IVC: Present and patent with normal respiratory phasicity. Hepatic Artery Velocity:  Not visualized Splenic Vein Velocity:  Not visualized Varices: Not visualized Ascites: Small amount of abdominal ascites mild gallbladder wall thickening measuring 4.4 mm, suspect related to chronic hepatic disease. Small gallbladder polyps noted. Trace perihepatic ascites. Small gallstones. Patent portal vein with normal hepatopetal flow. Right and middle hepatic veins are patent. Left hepatic vein and splenic vein not visualized well because of body habitus and ventilatory support. This limits the exam. No gross veno-occlusive process. IMPRESSION: Limited exam but patent portal vein with normal directional flow. Other findings as above. Electronically Signed   By: M.  Shick M.D.     On: 04/03/2018 14:32    Assessment and Plan:   1. Elevated Troponin:  In setting of acute renal failure and multisystem failure. No antecedent history of CAD  or chest pain TTE with normal EF an no RWMAls.  ECG only RBBB with no acute changes Peak troponin about 10. Not a candidate for heart cath given acute GI bleed and acute renal failure  Currently hemodynamically stable with no arrhythmias. Heparin d/c do to melena. No further cardiac  W/u indicated at this time Trend troponins       For questions or updates, please contact [place] Please consult [email or web address] for contact info under     Signed, Jenkins Rouge, MD  04/05/2018 8:34 AM

## 2018-04-06 ENCOUNTER — Other Ambulatory Visit: Payer: Self-pay

## 2018-04-06 ENCOUNTER — Inpatient Hospital Stay (HOSPITAL_COMMUNITY): Payer: Medicare (Managed Care)

## 2018-04-06 ENCOUNTER — Encounter (HOSPITAL_COMMUNITY): Payer: Self-pay | Admitting: *Deleted

## 2018-04-06 DIAGNOSIS — I82409 Acute embolism and thrombosis of unspecified deep veins of unspecified lower extremity: Secondary | ICD-10-CM

## 2018-04-06 DIAGNOSIS — I214 Non-ST elevation (NSTEMI) myocardial infarction: Secondary | ICD-10-CM

## 2018-04-06 LAB — CBC
HCT: 22 % — ABNORMAL LOW (ref 39.0–52.0)
HCT: 22.3 % — ABNORMAL LOW (ref 39.0–52.0)
HEMOGLOBIN: 7.1 g/dL — AB (ref 13.0–17.0)
Hemoglobin: 7.4 g/dL — ABNORMAL LOW (ref 13.0–17.0)
MCH: 31.8 pg (ref 26.0–34.0)
MCH: 32.3 pg (ref 26.0–34.0)
MCHC: 32.3 g/dL (ref 30.0–36.0)
MCHC: 33.2 g/dL (ref 30.0–36.0)
MCV: 98.7 fL (ref 80.0–100.0)
MCV: 97.4 fL (ref 80.0–100.0)
Platelets: 68 10*3/uL — ABNORMAL LOW (ref 150–400)
Platelets: 46 10*3/uL — ABNORMAL LOW (ref 150–400)
RBC: 2.23 MIL/uL — AB (ref 4.22–5.81)
RBC: 2.29 MIL/uL — ABNORMAL LOW (ref 4.22–5.81)
RDW: 13.9 % (ref 11.5–15.5)
RDW: 14.3 % (ref 11.5–15.5)
WBC: 5.9 10*3/uL (ref 4.0–10.5)
WBC: 10.9 10*3/uL — ABNORMAL HIGH (ref 4.0–10.5)
nRBC: 1.4 % — ABNORMAL HIGH (ref 0.0–0.2)
nRBC: 0.6 % — ABNORMAL HIGH (ref 0.0–0.2)

## 2018-04-06 LAB — POCT I-STAT 3, ART BLOOD GAS (G3+)
Acid-base deficit: 1 mmol/L (ref 0.0–2.0)
Bicarbonate: 23.7 mmol/L (ref 20.0–28.0)
O2 Saturation: 97 %
Patient temperature: 36.4
TCO2: 25 mmol/L (ref 22–32)
pCO2 arterial: 36 mmHg (ref 32.0–48.0)
pH, Arterial: 7.424 (ref 7.350–7.450)
pO2, Arterial: 86 mmHg (ref 83.0–108.0)

## 2018-04-06 LAB — PREPARE RBC (CROSSMATCH)

## 2018-04-06 LAB — POCT ACTIVATED CLOTTING TIME
ACTIVATED CLOTTING TIME: 153 s
ACTIVATED CLOTTING TIME: 169 s
ACTIVATED CLOTTING TIME: 186 s
ACTIVATED CLOTTING TIME: 208 s
Activated Clotting Time: 147 seconds
Activated Clotting Time: 164 seconds
Activated Clotting Time: 164 seconds
Activated Clotting Time: 169 seconds
Activated Clotting Time: 164 seconds
Activated Clotting Time: 180 seconds
Activated Clotting Time: 186 seconds
Activated Clotting Time: 224 seconds
Activated Clotting Time: 208 seconds
Activated Clotting Time: 219 seconds
Activated Clotting Time: 224 seconds
Activated Clotting Time: 219 seconds
Activated Clotting Time: 213 seconds
Activated Clotting Time: 213 seconds

## 2018-04-06 LAB — HEMOGLOBIN AND HEMATOCRIT, BLOOD
HCT: 19.6 % — ABNORMAL LOW (ref 39.0–52.0)
Hemoglobin: 6.4 g/dL — CL (ref 13.0–17.0)

## 2018-04-06 LAB — GLUCOSE, CAPILLARY
GLUCOSE-CAPILLARY: 154 mg/dL — AB (ref 70–99)
Glucose-Capillary: 117 mg/dL — ABNORMAL HIGH (ref 70–99)
Glucose-Capillary: 152 mg/dL — ABNORMAL HIGH (ref 70–99)
Glucose-Capillary: 137 mg/dL — ABNORMAL HIGH (ref 70–99)
Glucose-Capillary: 152 mg/dL — ABNORMAL HIGH (ref 70–99)

## 2018-04-06 LAB — COMPREHENSIVE METABOLIC PANEL
ALBUMIN: 1.9 g/dL — AB (ref 3.5–5.0)
ALT: 69 U/L — ABNORMAL HIGH (ref 0–44)
AST: 110 U/L — ABNORMAL HIGH (ref 15–41)
Alkaline Phosphatase: 76 U/L (ref 38–126)
Anion gap: 11 (ref 5–15)
BUN: 36 mg/dL — ABNORMAL HIGH (ref 8–23)
CO2: 24 mmol/L (ref 22–32)
Calcium: 6.8 mg/dL — ABNORMAL LOW (ref 8.9–10.3)
Chloride: 102 mmol/L (ref 98–111)
Creatinine, Ser: 2.26 mg/dL — ABNORMAL HIGH (ref 0.61–1.24)
GFR calc Af Amer: 33 mL/min — ABNORMAL LOW (ref >60)
GFR calc non Af Amer: 29 mL/min — ABNORMAL LOW (ref >60)
GLUCOSE: 135 mg/dL — AB (ref 70–99)
Potassium: 4.4 mmol/L (ref 3.5–5.1)
Sodium: 137 mmol/L (ref 135–145)
Total Bilirubin: 1.5 mg/dL — ABNORMAL HIGH (ref 0.3–1.2)
Total Protein: 4.4 g/dL — ABNORMAL LOW (ref 6.5–8.1)

## 2018-04-06 LAB — RENAL FUNCTION PANEL
Albumin: 1.8 g/dL — ABNORMAL LOW (ref 3.5–5.0)
Anion gap: 10 (ref 5–15)
BUN: 33 mg/dL — ABNORMAL HIGH (ref 8–23)
CHLORIDE: 105 mmol/L (ref 98–111)
CO2: 24 mmol/L (ref 22–32)
Calcium: 7 mg/dL — ABNORMAL LOW (ref 8.9–10.3)
Creatinine, Ser: 2.02 mg/dL — ABNORMAL HIGH (ref 0.61–1.24)
GFR calc Af Amer: 38 mL/min — ABNORMAL LOW (ref >60)
GFR, EST NON AFRICAN AMERICAN: 33 mL/min — AB (ref >60)
Glucose, Bld: 172 mg/dL — ABNORMAL HIGH (ref 70–99)
Phosphorus: 3.1 mg/dL (ref 2.5–4.6)
Potassium: 5 mmol/L (ref 3.5–5.1)
Sodium: 139 mmol/L (ref 135–145)

## 2018-04-06 LAB — MAGNESIUM: MAGNESIUM: 2.2 mg/dL (ref 1.7–2.4)

## 2018-04-06 LAB — LACTIC ACID, PLASMA: Lactic Acid, Venous: 1.7 mmol/L (ref 0.5–1.9)

## 2018-04-06 LAB — PROCALCITONIN: PROCALCITONIN: 7.82 ng/mL

## 2018-04-06 LAB — PHOSPHORUS: Phosphorus: 3.1 mg/dL (ref 2.5–4.6)

## 2018-04-06 LAB — HIV ANTIBODY (ROUTINE TESTING W REFLEX): HIV Screen 4th Generation wRfx: NONREACTIVE

## 2018-04-06 LAB — TROPONIN I: Troponin I: 3.79 ng/mL (ref <0.03)

## 2018-04-06 MED ORDER — SODIUM CHLORIDE 0.9% IV SOLUTION
Freq: Once | INTRAVENOUS | Status: AC
  Administered 2018-04-08: 22:00:00 via INTRAVENOUS

## 2018-04-06 NOTE — Progress Notes (Signed)
Daily Rounding Note  04/06/2018, 2:11 PM  LOS: 2 days   SUBJECTIVE:   Chief complaint: GI bleed with hematochezia, burgundy blood per NOGT Remains intubated.   Patient had a smear of non-melenic stool this morning.  There is a sandbag on top of the central line in his left groin because there is been blood oozing from that site.   Remains on stable dose vasopressin, decreasing dose phenylephrine.   Levophed discontinued at 6:30 AM today. Fentanyl drip in place.  OBJECTIVE:         Vital signs in last 24 hours:    Temp:  [97.3 F (36.3 C)-98.4 F (36.9 C)] 97.9 F (36.6 C) (12/23 1400) Pulse Rate:  [67-84] 75 (12/23 1400) Resp:  [17-33] 22 (12/23 1400) BP: (89-134)/(34-85) 101/34 (12/23 1400) SpO2:  [84 %-100 %] 100 % (12/23 1400) Arterial Line BP: (90-135)/(36-47) 93/39 (12/23 1400) FiO2 (%):  [30 %-60 %] 40 % (12/23 1200) Weight:  [82 kg] 82 kg (12/23 0500) Last BM Date: 04/05/18 Filed Weights   03/30/2018 0500 04/05/18 0500 04/06/18 0500  Weight: 75 kg 80.1 kg 82 kg   General: Pale, critically ill looking WM Heart: RRR.   NSR in the 70s on telemetry. Chest: Nonlabored breathing on the vent. Abdomen: Nontender, nondistended.  Bowel sounds hypoactive.  No HSM, masses, bruits, hernias.  Slight amount of blood at the left groin where the central line is. Extremities: No CCE. Neuro/Psych: Sedated on vent, did not respond to his name or to physical exam.  Intake/Output from previous day: 12/22 0701 - 12/23 0700 In: 4568.6 [I.V.:3052.5; NG/GT:30; IV Piggyback:1486.1] Out: 2922 [Urine:157; Emesis/NG output:150]  Intake/Output this shift: Total I/O In: 784.8 [I.V.:604.8; NG/GT:80; IV Piggyback:100] Out: 996 [Urine:25; Other:971]  Lab Results: Recent Labs    04/05/18 0412 04/05/18 1430 04/05/18 2013 04/06/18 0414  WBC 14.6* 12.0*  --  5.9  HGB 8.4* 7.2* 7.1* 7.1*  HCT 24.1* 22.3* 21.9* 22.0*  PLT 107* 83*   --  68*   BMET Recent Labs    04/05/18 0315 04/05/18 1426 04/06/18 0414  NA 140  140 138 137  K 4.3  4.4 4.5 4.4  CL 87*  87* 97* 102  CO2 23  26 19* 24  GLUCOSE 127*  126* 182* 135*  BUN 56*  55* 44* 36*  CREATININE 3.77*  3.78* 2.81* 2.26*  CALCIUM 5.9*  6.0* 6.4* 6.8*   LFT Recent Labs    04/12/2018 0830  04/05/18 0315 04/05/18 1426 04/06/18 0414  PROT 4.4*  --  4.3*  --  4.4*  ALBUMIN 2.2*   < > 2.1*  2.0* 2.0* 1.9*  AST 232*  --  141*  --  110*  ALT 118*  --  86*  --  69*  ALKPHOS 124  --  92  --  76  BILITOT 2.3*  --  2.1*  --  1.5*   < > = values in this interval not displayed.   PT/INR Recent Labs    04/06/2018 0830 04/05/18 1648  LABPROT 17.0* 22.1*  INR 1.40 1.96   Hepatitis Panel No results for input(s): HEPBSAG, HCVAB, HEPAIGM, HEPBIGM in the last 72 hours.  Studies/Results: Dg Chest Port 1 View  Result Date: 04/06/2018 CLINICAL DATA:  Acute respiratory failure.  Shortness of breath. EXAM: PORTABLE CHEST 1 VIEW COMPARISON:  04/05/2018. FINDINGS: Endotracheal tube, NG tube, right IJ line stable position. Heart size stable. Bibasilar atelectasis/infiltrates. Small left pleural  effusion. No pneumothorax. IMPRESSION: 1.  Lines and tubes in stable position. 2.  Bibasilar atelectasis/infiltrates.  Small left pleural effusion. Electronically Signed   By: [place]   On: 04/06/2018 06:55   Scheduled Meds: . sodium chloride   Intravenous Once  . aspirin  81 mg Per Tube Daily  . atorvastatin  80 mg Per Tube q1800  . chlorhexidine gluconate (MEDLINE KIT)  15 mL Mouth Rinse BID  . Chlorhexidine Gluconate Cloth  6 each Topical Daily  . donepezil  10 mg Oral QHS  . hydrocortisone sod succinate (SOLU-CORTEF) inj  50 mg Intravenous Q6H  . insulin aspart  0-15 Units Subcutaneous Q4H  . mouth rinse  15 mL Mouth Rinse 10 times per day  . [START ON 04/08/2018] pantoprazole  40 mg Intravenous Q12H  . sodium chloride flush  10-40 mL Intracatheter Q12H    Continuous Infusions: .  prismasol BGK 4/2.5 300 mL/hr at 04/05/18 2159  .  prismasol BGK 4/2.5 300 mL/hr at 04/05/18 2159  . sodium chloride Stopped (04/05/18 0625)  . sodium chloride    . ceFEPime (MAXIPIME) IV Stopped (04/06/18 2449)  . fentaNYL infusion INTRAVENOUS Stopped (04/06/18 0830)  . heparin 10,000 units/ 20 mL infusion syringe 1,050 Units/hr (04/06/18 1407)  . heparin 999 mL/hr at 04/07/2018 0444  . pantoprozole (PROTONIX) infusion 8 mg/hr (04/06/18 1400)  . phenylephrine (NEO-SYNEPHRINE) Adult infusion 20 mcg/min (04/06/18 1400)  . prismasol BGK 4/2.5 1,500 mL/hr at 04/06/18 1254  . vancomycin Stopped (04/05/18 1953)  . vasopressin (PITRESSIN) infusion - *FOR SHOCK* 0.03 Units/min (04/06/18 1400)   PRN Meds:.Place/Maintain arterial line **AND** sodium chloride, bisacodyl, docusate, fentaNYL, heparin, heparin, heparin, midazolam, midazolam, sennosides, sodium chloride flush   ASSESMENT:   *   GI bleed in setting of IV heparin.  Blood per NG tube, hematochezia. Heparin gtt discontinued yesterday at 0700,  but is receiving Heparin prn into CRRT catheter (last given at 0420 this AM).  Protonix GGT in place.  *    Anemia.  Baseline Hgb not known but macrocytic with MCV 104 at admission. Hgb stable on last 3 assays.  Has not received transfusion.  *    Abnormal LFTs.  Improved.  03/17/2018 ultrasound abdomen Limited study due to patient condition.  Possible gallstone.  Probable fatty liver.  4 mm CBD  *     Acute thrombocytopenia.  *   AKI in setting of septic shock, hypotension requiring pressors.  CRRT started 12/21.  Renal function improving. Source of sepsis unclear, no growth on pancultures.  Empiric vancomycin, doxepin in place. GFR improved.  Lactic acidosis improved.  Remains relatively hypotensive though overall blood pressures improved.  *    Elevated troponins 10.26 >> 3.7.    *    Baseline dementia.  Creased diminished AMS over the last week PTA.  At baseline  he is oriented x2 and conversant.   PLAN   *   CBC in AM.  Hgb/crit at 1700.    *     No EGD planned at the present time.  Dr. Tarri Glenn will be seeing the patient later today.    Azucena Freed  04/06/2018, 2:11 PM Phone [phone]

## 2018-04-06 NOTE — Progress Notes (Signed)
CRITICAL VALUE ALERT  Critical Value:  Hgb 6.4  Date & Time Notied:  04/06/18 1747  Provider Notified: Yes  Orders Received/Actions taken: Awaiting orders at this time

## 2018-04-06 NOTE — Progress Notes (Signed)
[facility] ROUNDING NOTE   Subjective:   Patient continues on CRRT.  This appears to have been initiated 03/21/2018  Blood pressure 102/48 pulse 73 temperature 98  IV vasopressin [11.2]  IV cefepime 2 g daily  Minimal urine output 04/05/2018 186 cc CRRT 2.4 L 04/05/2018  Sodium 137 potassium 4.4 chloride 102 CO2 24 glucose 135 BUN 36 creatinine 2.26 phosphorus 3.1 magnesium 2.2 AST 110 ALT 69 Albumin 1.9.  WBC 5.9 hemoglobin 7.1 platelets 68  Chest x-ray bibasilar atelectasis/infiltrate small left pleural effusion   Objective:  Vital signs in last 24 hours:  Temp:  [97.3 F (36.3 C)-98.4 F (36.9 C)] 98.1 F (36.7 C) (12/23 1000) Pulse Rate:  [67-85] 72 (12/23 0945) Resp:  [17-33] 19 (12/23 1000) BP: (100-136)/(47-89) 131/60 (12/23 0930) SpO2:  [84 %-100 %] 99 % (12/23 0945) Arterial Line BP: (106-143)/(36-50) 106/44 (12/23 1000) FiO2 (%):  [30 %-60 %] 40 % (12/23 0800) Weight:  [82 kg] 82 kg (12/23 0500)  Weight change: 1.9 kg Filed Weights   03/28/2018 0500 04/05/18 0500 04/06/18 0500  Weight: 75 kg 80.1 kg 82 kg    Intake/Output: I/O last 3 completed shifts: In: 6248.6 [I.V.:4216.8; NG/GT:70; IV Piggyback:1961.8] Out: 6789 [Urine:253; Emesis/NG output:350; FYBOF:7510]   Intake/Output this shift:  Total I/O In: 546.9 [I.V.:366.9; NG/GT:80; IV Piggyback:100] Out: 541 [Other:541]  CVS- RRR JVP not elevated no room murmurs rubs or gallops RS- CTA diminished breath sounds at bases ABD- BS present soft non-distended EXT- no edema   Basic Metabolic Panel: Recent Labs  Lab 04/07/2018 0145 04/03/2018 0249 03/16/2018 0830 03/19/2018 1050 03/18/2018 1600 04/05/18 0315 04/05/18 1426 04/06/18 0414  NA 144 147* 145 141 142 140  140 138 137  K 7.3* 7.0* 6.1* 5.5* 4.9 4.3  4.4 4.5 4.4  CL 108 108 102 105 96* 87*  87* 97* 102  CO2 <7* <7* 10*  --  16* 23  26 19* 24  GLUCOSE 252* 268* 280* 220* 124* 127*  126* 182* 135*  BUN 103* 98* 88* 81* 69* 56*   55* 44* 36*  CREATININE 7.68* 7.15* 6.22* 5.40* 4.90* 3.77*  3.78* 2.81* 2.26*  CALCIUM 7.6* 7.2* 6.5*  --  6.0* 5.9*  6.0* 6.4* 6.8*  MG 2.2 2.1  --   --   --  1.6*  --  2.2  PHOS 13.6* 12.8* 11.2*  --  5.3* 3.6  3.6 4.0 3.1    Liver Function Tests: Recent Labs  Lab 03/22/2018 0145 04/03/2018 0830 03/26/2018 1600 04/05/18 0315 04/05/18 1426 04/06/18 0414  AST 258* 232*  --  141*  --  110*  ALT 123* 118*  --  86*  --  69*  ALKPHOS 129* 124  --  92  --  76  BILITOT 1.8* 2.3*  --  2.1*  --  1.5*  PROT 4.8* 4.4*  --  4.3*  --  4.4*  ALBUMIN 2.2* 2.2* 2.1* 2.1*  2.0* 2.0* 1.9*   No results for input(s): LIPASE, AMYLASE in the last 168 hours. Recent Labs  Lab 03/31/2018 0430 04/08/2018 1709  AMMONIA 63* 29    CBC: Recent Labs  Lab 03/18/2018 0145 03/26/2018 0249  03/29/2018 1100 04/05/18 0412 04/05/18 1430 04/05/18 2013 04/06/18 0414  WBC 27.6* 21.0*  --  25.7* 14.6* 12.0*  --  5.9  NEUTROABS 21.5*  --   --   --   --   --   --   --   HGB 11.2* 11.3*   < >  10.7* 8.4* 7.2* 7.1* 7.1*  HCT 39.2 38.8*   < > 34.6* 24.1* 22.3* 21.9* 22.0*  MCV 114.0* 111.8*  --  104.2* 96.8 100.0  --  98.7  PLT 273 198  --  160 107* 83*  --  68*   < > = values in this interval not displayed.    Cardiac Enzymes: Recent Labs  Lab 04/11/2018 1513 04/09/2018 2028 04/05/18 0315 04/05/18 0819 04/06/18 0624  TROPONINI 4.78* 9.41* 10.75* 10.26* 3.79*    BNP: Invalid input(s): POCBNP  CBG: Recent Labs  Lab 04/05/18 1638 04/05/18 2009 04/05/18 2345 04/06/18 0406 04/06/18 0756  GLUCAP 144* 127* 113* 117* 152*    Microbiology: Results for orders placed or performed during the hospital encounter of 03/22/2018  MRSA PCR Screening     Status: None   Collection Time: 03/22/2018 12:59 AM  Result Value Ref Range Status   MRSA by PCR NEGATIVE NEGATIVE Final    Comment:        The GeneXpert MRSA Assay (FDA approved for NASAL specimens only), is one component of a comprehensive MRSA  colonization surveillance program. It is not intended to diagnose MRSA infection nor to guide or monitor treatment for MRSA infections. Performed at [facility], [place] [address], [place], [place] [address]   Culture, blood (routine x 2)     Status: None (Preliminary result)   Collection Time: 03/19/2018  1:35 AM  Result Value Ref Range Status   Specimen Description BLOOD LEFT HAND  Final   Special Requests   Final    BOTTLES DRAWN AEROBIC AND ANAEROBIC Blood Culture adequate volume   Culture   Final    NO GROWTH 1 DAY Performed at [facility], [place] [address] [place], [place] [address]    Report Status PENDING  Incomplete  Culture, blood (routine x 2)     Status: None (Preliminary result)   Collection Time: 03/30/2018  2:23 AM  Result Value Ref Range Status   Specimen Description SITE NOT SPECIFIED  Final   Special Requests   Final    BOTTLES DRAWN AEROBIC AND ANAEROBIC Blood Culture adequate volume   Culture   Final    NO GROWTH 1 DAY Performed at [facility], [place] [address] [place], [place] [address]    Report Status PENDING  Incomplete  Culture, respiratory (tracheal aspirate)     Status: None (Preliminary result)   Collection Time: 04/05/18  7:12 AM  Result Value Ref Range Status   Specimen Description TRACHEAL ASPIRATE  Final   Special Requests NONE  Final   Gram Stain   Final    FEW WBC PRESENT,BOTH PMN AND MONONUCLEAR MODERATE GRAM NEGATIVE RODS FEW GRAM POSITIVE COCCI IN CHAINS RARE GRAM POSITIVE RODS Performed at [facility], [place] [address], [place], [place] [address]    Culture MODERATE GRAM NEGATIVE RODS  Final   Report Status PENDING  Incomplete    Coagulation Studies: Recent Labs    03/28/2018 0145 03/29/2018 0830 04/05/18 1648  LABPROT 17.7* 17.0* 22.1*  INR 1.48 1.40 1.96    Urinalysis: Recent Labs    03/19/2018 1955  COLORURINE YELLOW  LABSPEC >1.030*  PHURINE 5.0  GLUCOSEU NEGATIVE  [place] >300*  NITRITE NEGATIVE  LEUKOCYTESUR NEGATIVE      Imaging: US Abdomen Complete  Result Date: 03/29/2018 CLINICAL DATA:  Elevated LFTs, acute kidney injury; on ventilator, history diabetes mellitus EXAM:  ABDOMEN ULTRASOUND COMPLETE COMPARISON:  None FINDINGS: Gallbladder: Multiple irregular echogenic foci within gallbladder lumen, without definite shadowing, question polyps versus nonshadowing calculi. Patient was not mobile, on ventilator, limiting assessment of motion. Mild gallbladder wall thickening. Unable to assess for Percell Miller sign due to patient condition on ventilator. No pericholecystic fluid. Common bile duct: Diameter: 4 mm diameter Liver: Echogenic parenchyma, likely fatty infiltration though this can be seen with cirrhosis and certain infiltrative disorders. No focal hepatic mass or nodularity. Portal vein patent with normal direction of blood flow towards the liver. IVC: Obscured by bowel gas Pancreas: Obscured by bowel gas Spleen: Not visualized, likely due to limited patient positioning Right Kidney: Length: 11.7 cm. Cortical thinning. Increased cortical echogenicity. No mass or hydronephrosis. Left Kidney: Length: 12.9 cm. Cortical thinning period increased cortical echogenicity. No mass or hydronephrosis. Abdominal aorta: Predominately obscured due to bowel gas. RIGHT pleural effusion noted. Perihepatic ascites noted. Other findings: None. IMPRESSION: Limited exam due to patient condition. Nonshadowing echogenic foci within gallbladder question polyps up to 10 mm diameter versus nonshadowing calculi. Probable fatty infiltration of liver. Medical renal disease changes of both kidneys without gross mass or hydronephrosis. Inadequate visualization of IVC, pancreas, spleen and aorta. Electronically Signed   By: Lavonia Dana M.D.   On: 04/07/2018 15:06   Dg Chest Port 1 View  Result Date: 04/06/2018 CLINICAL DATA:  Acute respiratory failure.  Shortness of  breath. EXAM: PORTABLE CHEST 1 VIEW COMPARISON:  04/05/2018. FINDINGS: Endotracheal tube, NG tube, right IJ line stable position. Heart size stable. Bibasilar atelectasis/infiltrates. Small left pleural effusion. No pneumothorax. IMPRESSION: 1.  Lines and tubes in stable position. 2.  Bibasilar atelectasis/infiltrates.  Small left pleural effusion. Electronically Signed   By: Marcello Moores  Register   On: 04/06/2018 06:55   Dg Chest Port 1 View  Result Date: 04/05/2018 CLINICAL DATA:  Acute respiratory failure. EXAM: PORTABLE CHEST 1 VIEW COMPARISON:  03/31/2018 FINDINGS: Endotracheal tube has tip 6.1 cm above the carina. Nasogastric tube has tip and side-port over the stomach in the left upper quadrant. Right IJ central venous catheter has tip over the SVC and is unchanged. Lungs are adequately inflated with subtle linear bibasilar density likely atelectasis. No effusion. Cardiomediastinal silhouette and remainder of the exam is unchanged. IMPRESSION: Mild bibasilar linear density unchanged likely atelectasis. Tubes and lines as described. Electronically Signed   By: Marin Olp M.D.   On: 04/05/2018 07:13   US Liver Doppler  Result Date: 04/09/2018 CLINICAL DATA:  Elevated LFTs EXAM: DUPLEX ULTRASOUND OF LIVER TECHNIQUE: Color and duplex Doppler ultrasound was performed to evaluate the hepatic in-flow and out-flow vessels. COMPARISON:  04/04/2008 FINDINGS: Liver: Mild increased echogenicity. Grossly normal hepatic contour. Small amount of perihepatic ascites. Slight intrahepatic biliary prominence without definite obstruction pattern. No focal lesion, mass or intrahepatic biliary ductal dilatation. Portal Vein Velocities Main:  40 cm/sec Right:  15 cm/sec Left:  25 cm/sec Hepatic Vein Velocities Right:  21 cm/sec Middle:  27 cm/sec Left:  Not visualized IVC: Present and patent with normal respiratory phasicity. Hepatic Artery Velocity:  Not visualized Splenic Vein Velocity:  Not visualized Varices: Not  visualized Ascites: Small amount of abdominal ascites mild gallbladder wall thickening measuring 4.4 mm, suspect related to chronic hepatic disease. Small gallbladder polyps noted. Trace perihepatic ascites. Small gallstones. Patent portal vein with normal hepatopetal flow. Right and middle hepatic veins are patent. Left hepatic vein and splenic vein not visualized well because of body habitus and ventilatory support. This limits the exam. No  gross veno-occlusive process. IMPRESSION: Limited exam but patent portal vein with normal directional flow. Other findings as above. Electronically Signed   By: Jerilynn Mages.  Shick M.D.   On: 04/06/2018 14:32   Vas [place] Lower Extremity Venous (dvt)  Result Date: 04/06/2018  Lower Venous Study Performing Technologist: Antonieta Pert RDMS, RVT  Examination Guidelines: A complete evaluation includes B-mode imaging, spectral Doppler, color Doppler, and power Doppler as needed of all accessible portions of each vessel. Bilateral testing is considered an integral part of a complete examination. Limited examinations for reoccurring indications may be performed as noted.  Right Venous Findings: +---------+---------------+---------+-----------+----------+------------------+          CompressibilityPhasicitySpontaneityPropertiesSummary            +---------+---------------+---------+-----------+----------+------------------+ CFV      Full           Yes      Yes                                     +---------+---------------+---------+-----------+----------+------------------+ SFJ      Full                                                            +---------+---------------+---------+-----------+----------+------------------+ FV Prox  Full                                                            +---------+---------------+---------+-----------+----------+------------------+ FV Mid   Full                                                             +---------+---------------+---------+-----------+----------+------------------+ FV DistalFull                                                            +---------+---------------+---------+-----------+----------+------------------+ PFV      Full                                                            +---------+---------------+---------+-----------+----------+------------------+ POP      Full           Yes      Yes                                     +---------+---------------+---------+-----------+----------+------------------+ PTV      Full  poor visualization +---------+---------------+---------+-----------+----------+------------------+ PERO     Full                                         poor visualization +---------+---------------+---------+-----------+----------+------------------+ GSV      Full                                                            +---------+---------------+---------+-----------+----------+------------------+ atherosclerotic changes noted  Left Venous Findings: +---------+---------------+---------+-----------+----------+----------------+          CompressibilityPhasicitySpontaneityPropertiesSummary          +---------+---------------+---------+-----------+----------+----------------+ CFV                                                   Not visualized   +---------+---------------+---------+-----------+----------+----------------+ SFJ                                                   Not visualized   +---------+---------------+---------+-----------+----------+----------------+ FV Prox  Full           Yes      Yes                                   +---------+---------------+---------+-----------+----------+----------------+ FV Mid   Full                                                           +---------+---------------+---------+-----------+----------+----------------+ FV DistalFull                                                          +---------+---------------+---------+-----------+----------+----------------+ PFV                                                   Not visualized   +---------+---------------+---------+-----------+----------+----------------+ POP      Full                                         poor compression +---------+---------------+---------+-----------+----------+----------------+ PTV      Full                                                          +---------+---------------+---------+-----------+----------+----------------+  PERO     Full                                                          +---------+---------------+---------+-----------+----------+----------------+ GSV      Full                                                          +---------+---------------+---------+-----------+----------+----------------+ unable to visualize CFV due to bandages, poor visualization of popliteal vein due to poor patient positioning.    Summary: Right: There is no evidence of deep vein thrombosis in the lower extremity. However, portions of this examination were limited- see technologist comments above. No cystic structure found in the popliteal fossa. Left: There is no evidence of deep vein thrombosis in the lower extremity. However, portions of this examination were limited- see technologist comments above. No cystic structure found in the popliteal fossa.  *See table(s) above for measurements and observations.    Preliminary      Medications:   .  prismasol BGK 4/2.5 300 mL/hr at 04/05/18 2159  .  prismasol BGK 4/2.5 300 mL/hr at 04/05/18 2159  . sodium chloride Stopped (04/05/18 0625)  . sodium chloride    . ceFEPime (MAXIPIME) IV Stopped (04/06/18 3557)  . fentaNYL infusion INTRAVENOUS Stopped (04/06/18 0830)  . heparin  10,000 units/ 20 mL infusion syringe 750 Units/hr (04/06/18 1011)  . heparin 999 mL/hr at 04/11/2018 0444  . pantoprozole (PROTONIX) infusion 8 mg/hr (04/06/18 1000)  . phenylephrine (NEO-SYNEPHRINE) Adult infusion 120 mcg/min (04/06/18 1000)  . prismasol BGK 4/2.5 1,500 mL/hr at 04/06/18 0939  . vancomycin Stopped (04/05/18 1953)  . vasopressin (PITRESSIN) infusion - *FOR SHOCK* 0.03 Units/min (04/06/18 1000)   . sodium chloride   Intravenous Once  . aspirin  81 mg Per Tube Daily  . atorvastatin  80 mg Per Tube q1800  . chlorhexidine gluconate (MEDLINE KIT)  15 mL Mouth Rinse BID  . Chlorhexidine Gluconate Cloth  6 each Topical Daily  . donepezil  10 mg Oral QHS  . hydrocortisone sod succinate (SOLU-CORTEF) inj  50 mg Intravenous Q6H  . insulin aspart  0-15 Units Subcutaneous Q4H  . mouth rinse  15 mL Mouth Rinse 10 times per day  . [START ON 04/08/2018] pantoprazole  40 mg Intravenous Q12H  . sodium chloride flush  10-40 mL Intracatheter Q12H   Place/Maintain arterial line **AND** sodium chloride, bisacodyl, docusate, fentaNYL, heparin, heparin, heparin, midazolam, midazolam, sennosides, sodium chloride flush  Assessment/ Plan:   Acute kidney injury presumed no data prior severe hypertension pressors CRRT initiated 03/26/2018.  Hypertension/volume appears to be hypotensive requiring pressors continues to remove fluid with dialysis  Hyperkalemia metabolic acidosis supportive care appears to have improved  Anemia was using heparin with CRRT will continue to follow  Electrolytes appear to be stable  Prognosis poor currently DNR status  GI bleed appreciate gastroenterology input anticoagulation having been stopped IV Protonix started.  Appreciate help from Dr. Tarri Glenn  History of CVA CT head without contrast 04/03/2018 atrophy with mild small vessel ischemic changes of white matter  History of dementia  Echo 3220 diastolic dysfunction  Septic shock  continues on vancomycin and  cefepime.  Cultures no growth.  Source unclear    LOS: 2 Sherril Croon '@TODAY' '@10' :26 AM

## 2018-04-06 NOTE — Care Management Note (Signed)
Case Management Note Hortencia ConradiWendi Shaida Route, RN MSN CCM Transitions of [place] [place] [phone]  Patient Details  Name: Craig Mason MRN: [id number] Date of Birth: [birth year]  Subjective/Objective:           Acute encephalopathy         Action/Plan: PTA from [facility]. Currently on CRRT. Intubated. On vaso and neo drips. Craig Mason (sister/HCPOA) at bedside this morning. CSW aware of nursing home status PTA. Will continue to follow for transition of care needs.   Expected Discharge Date:  04/16/18               Expected Discharge Plan:  Skilled Nursing Facility  In-House Referral:  Clinical Social Work  Discharge planning Services  CM Consult  Post Acute Care Choice:    Choice offered to:     DME Arranged:  N/A DME Agency:  NA  HH Arranged:  NA HH Agency:  NA  Status of Service:  In process, will continue to follow  If discussed at Long Length of Stay Meetings, dates discussed:    Additional Comments:  Bess KindsWendi B Clarrisa Kaylor, RN 04/06/2018, 4:20 PM

## 2018-04-06 NOTE — Progress Notes (Signed)
NAME:  Craig Mason, MRN:  [id number], DOB:  08-28-1950, LOS: 2 ADMISSION DATE:  03/30/2018, CHIEF COMPLAINT:  Altered mental status  History of present illness   67 year old man, former smoker, with dementia, CVA, T2DM, history of respiratory failure 2/2 pneumonia with previous trach (decannulated now), GERD presenting with altered mental status. Lives at [facility]. Sister noted that his mental status had changed for the last week - less conversant and interactive.  He had acute worsening of mental status on day of admission with episode of emesis.At baseline he is conversant and oriented x 2 (gets confused about month).  Found to be in multiorgan failure.  [facility] and [facility].    Past Medical History  Former smoker, with dementia, history CVA, T2DM, history of respiratory failure 2/2 pneumonia with previous trach (decannulated now), GERD, osteoporosis  Significant Hospital Events   12/21 admission, vas cath placement and CRRT initiated  Consults:  Nephrology 12/21 Cardiology 12/22 GI 12/22  Procedures:  Rt IJ vas cath placement 12/21  Rt radial A line 12/20 > 12/21 Rt fem A line 12/21 > Lt fem CVL 12/20>  Significant Diagnostic Tests:  CT head w/o contrast on 12/20: No CT evidence for acute intracranial abnormality. Atrophy and mild small vessel ischemic changes of the white matter.  Echocardiogram 12/21-mild LVH, EF 55-60%, no regional wall motion abnormality, grade 1 diastolic dysfunction.  Chest x-ray 12/23-lines and tubes in stable position, bibasal atelectasis.  Micro Data:  BCx 12/21 No growth Resp Cx 12/21 GPC, GPR  Antimicrobials:  Vanc 12/21> Cefepime 12/21>  Interim history/subjective:    Objective   Blood pressure (!) 118/56, pulse 72, temperature 98.1 F (36.7 C), resp. rate 19, height 5\' 8"  (1.727 m), weight 82 kg, SpO2 100 %.    Vent Mode: PRVC FiO2 (%):  [30 %-60 %] 40 % Set Rate:   [18 bmp] 18 bmp Vt Set:  [430 mL] 430 mL PEEP:  [5 cmH20] 5 cmH20 Pressure Support:  [5 cmH20] 5 cmH20 Plateau Pressure:  [11 cmH20-21 cmH20] 21 cmH20   Intake/Output Summary (Last 24 hours) at 04/06/2018 [id number] Last data filed at 04/06/2018 0800 Gross per 24 hour  Intake 4299.86 ml  Output 2992 ml  Net 1307.86 ml   Filed Weights   04/05/2018 0500 04/05/18 0500 04/06/18 0500  Weight: 75 kg 80.1 kg 82 kg    Examination: Gen:      No acute distress HEENT:  EOMI, sclera anicteric, ETT Neck:     No masses; no thyromegaly Lungs:    Clear to auscultation bilaterally; normal respiratory effort CV:         Regular rate and rhythm; no murmurs Abd:      + bowel sounds; soft, non-tender; no palpable masses, no distension Ext:    No edema; adequate peripheral perfusion Skin:      Warm and dry; no rash Neuro: Sedated, unresponsive  Assessment & Plan:  Multiorgan failure Possible septic shock with lactic acidosis-unclear source of infection Wean pressors Continue stress dose steroids Follow cultures, procalcitonin  Elevated troponins, NSTEMI Appreciate consult from cardiology Continue supportive care for now Not a candidate for heart cath due to GI bleed and renal failure  GI bleed Follow CBC.  Transfuse for hemoglobin less than 7 Continue PPI drip GI on board = Acute kidney injury Hyperkalemia and metabolic acidosis with increased anion gap > resolved  Continue CRRT  Diabetes, hyperglycemia SSI coverage  Elevated LFTs,  ammonia Likely from shock liver.  Follow labs. Right upper quadrant ultrasound is unremarkable  Best practice:  Diet: NPO Pain/Anxiety/Delirium protocol (if indicated): Fentanyl gtt, versed prn. RASS goal 0 VAP protocol (if indicated): ordered DVT prophylaxis: SCDs GI prophylaxis: PPI drip Glucose control: SSI Mobility: Bed rest Code Status: DNR confirmed with sister.  Family Communication: Sister is healthcare POA. Updated her and brother in law at  bedside.  Disposition: MICU  The patient is critically ill with multiple organ system failure and requires high complexity decision making for assessment and support, frequent evaluation and titration of therapies, advanced monitoring, review of radiographic studies and interpretation of complex data.   Critical Care Time devoted to patient care services, exclusive of separately billable procedures, described in this note is 35 minutes.   Chilton GreathousePraveen Lyden Redner MD [facility] Pager [phone] If no answer or after 3pm call: [phone] 04/06/2018, 8:44 AM

## 2018-04-06 NOTE — Progress Notes (Addendum)
Progress Note  Patient Name: Craig Mason Date of Encounter: 04/06/2018  Primary Cardiologist: Jenkins Rouge, MD   Subjective   Intubated. Sister at bedside.   Inpatient Medications    Scheduled Meds: . sodium chloride   Intravenous Once  . aspirin  81 mg Per Tube Daily  . atorvastatin  80 mg Per Tube q1800  . chlorhexidine gluconate (MEDLINE KIT)  15 mL Mouth Rinse BID  . Chlorhexidine Gluconate Cloth  6 each Topical Daily  . donepezil  10 mg Oral QHS  . hydrocortisone sod succinate (SOLU-CORTEF) inj  50 mg Intravenous Q6H  . insulin aspart  0-15 Units Subcutaneous Q4H  . mouth rinse  15 mL Mouth Rinse 10 times per day  . [START ON 04/08/2018] pantoprazole  40 mg Intravenous Q12H  . sodium chloride flush  10-40 mL Intracatheter Q12H   Continuous Infusions: .  prismasol BGK 4/2.5 300 mL/hr at 04/05/18 2159  .  prismasol BGK 4/2.5 300 mL/hr at 04/05/18 2159  . sodium chloride Stopped (04/05/18 0625)  . sodium chloride    . ceFEPime (MAXIPIME) IV Stopped (04/06/18 9528)  . fentaNYL infusion INTRAVENOUS Stopped (04/06/18 0830)  . heparin 10,000 units/ 20 mL infusion syringe 750 Units/hr (04/06/18 1011)  . heparin 999 mL/hr at 03/30/2018 0444  . pantoprozole (PROTONIX) infusion 8 mg/hr (04/06/18 1000)  . phenylephrine (NEO-SYNEPHRINE) Adult infusion 120 mcg/min (04/06/18 1000)  . prismasol BGK 4/2.5 1,500 mL/hr at 04/06/18 0939  . vancomycin Stopped (04/05/18 1953)  . vasopressin (PITRESSIN) infusion - *FOR SHOCK* 0.03 Units/min (04/06/18 1000)   PRN Meds: Place/Maintain arterial line **AND** sodium chloride, bisacodyl, docusate, fentaNYL, heparin, heparin, heparin, midazolam, midazolam, sennosides, sodium chloride flush   Vital Signs    Vitals:   04/06/18 0915 04/06/18 0930 04/06/18 0945 04/06/18 1000  BP:  131/60    Pulse: 76 71 72   Resp: _0 Temp: 98.4 F (36.9 C) 98.2 F (36.8 C) 98.2 F (36.8 C) 98.1 F (36.7 C)  TempSrc:      SpO2: 96% 98% 99%     Weight:      Height:        Intake/Output Summary (Last 24 hours) at 04/06/2018 1036 Last data filed at 04/06/2018 1000 Gross per 24 hour  Intake 3573.26 ml  Output 3213 ml  Net 360.26 ml   Filed Weights   03/22/2018 0500 04/05/18 0500 04/06/18 0500  Weight: 75 kg 80.1 kg 82 kg    Telemetry    SR- Personally Reviewed  ECG    04/09/2018, sinus tach 113 RBBB - Personally Reviewed  Physical Exam   GEN: intubated, critically ill    Neck: No JVD Cardiac: RRR, no murmurs, rubs, or gallops.  Respiratory: Intubated, Clear to auscultation bilaterally. GI: Soft, nontender, non-distended  MS: No edema; cool extremities, weak DPs Neuro:  intubated and sedated Psych: intubated and sedated    Labs    Chemistry Recent Labs  Lab 03/31/2018 0830  04/05/18 0315 04/05/18 1426 04/06/18 0414  NA 145   < > 140  140 138 137  K 6.1*   < > 4.3  4.4 4.5 4.4  CL 102   < > 87*  87* 97* 102  CO2 10*   < > 23  26 19* 24  GLUCOSE 280*   < > 127*  126* 182* 135*  BUN 88*   < > 56*  55* 44* 36*  CREATININE 6.22*   < > 3.77*  3.78* 2.81*  2.26*  CALCIUM 6.5*   < > 5.9*  6.0* 6.4* 6.8*  PROT 4.4*  --  4.3*  --  4.4*  ALBUMIN 2.2*   < > 2.1*  2.0* 2.0* 1.9*  AST 232*  --  141*  --  110*  ALT 118*  --  86*  --  69*  ALKPHOS 124  --  92  --  76  BILITOT 2.3*  --  2.1*  --  1.5*  GFRNONAA 8*   < > 15*  15* 22* 29*  GFRAA 10*   < > 18*  18* 26* 33*  ANIONGAP 33*   < > 30*  27* 22* 11   < > = values in this interval not displayed.     Hematology Recent Labs  Lab 04/05/18 0412 04/05/18 1430 04/05/18 2013 04/06/18 0414  WBC 14.6* 12.0*  --  5.9  RBC 2.49* 2.23*  --  2.23*  HGB 8.4* 7.2* 7.1* 7.1*  HCT 24.1* 22.3* 21.9* 22.0*  MCV 96.8 100.0  --  98.7  MCH 33.7 32.3  --  31.8  MCHC 34.9 32.3  --  32.3  RDW 13.6 14.0  --  13.9  PLT 107* 83*  --  68*    Cardiac Enzymes Recent Labs  Lab 04/08/2018 2028 04/05/18 0315 04/05/18 0819 04/06/18 0624  TROPONINI 9.41* 10.75*  10.26* 3.79*   No results for input(s): TROPIPOC in the last 168 hours.   BNPNo results for input(s): BNP, PROBNP in the last 168 hours.   DDimer No results for input(s): DDIMER in the last 168 hours.   Radiology    US Abdomen Complete  Result Date: 04/03/2018 CLINICAL DATA:  Elevated LFTs, acute kidney injury; on ventilator, history diabetes mellitus EXAM: ABDOMEN ULTRASOUND COMPLETE COMPARISON:  None FINDINGS: Gallbladder: Multiple irregular echogenic foci within gallbladder lumen, without definite shadowing, question polyps versus nonshadowing calculi. Patient was not mobile, on ventilator, limiting assessment of motion. Mild gallbladder wall thickening. Unable to assess for Percell Miller sign due to patient condition on ventilator. No pericholecystic fluid. Common bile duct: Diameter: 4 mm diameter Liver: Echogenic parenchyma, likely fatty infiltration though this can be seen with cirrhosis and certain infiltrative disorders. No focal hepatic mass or nodularity. Portal vein patent with normal direction of blood flow towards the liver. IVC: Obscured by bowel gas Pancreas: Obscured by bowel gas Spleen: Not visualized, likely due to limited patient positioning Right Kidney: Length: 11.7 cm. Cortical thinning. Increased cortical echogenicity. No mass or hydronephrosis. Left Kidney: Length: 12.9 cm. Cortical thinning period increased cortical echogenicity. No mass or hydronephrosis. Abdominal aorta: Predominately obscured due to bowel gas. RIGHT pleural effusion noted. Perihepatic ascites noted. Other findings: None. IMPRESSION: Limited exam due to patient condition. Nonshadowing echogenic foci within gallbladder question polyps up to 10 mm diameter versus nonshadowing calculi. Probable fatty infiltration of liver. Medical renal disease changes of both kidneys without gross mass or hydronephrosis. Inadequate visualization of IVC, pancreas, spleen and aorta. Electronically Signed   By: Lavonia Dana M.D.   On:  03/17/2018 15:06   Dg Chest Port 1 View  Result Date: 04/06/2018 CLINICAL DATA:  Acute respiratory failure.  Shortness of breath. EXAM: PORTABLE CHEST 1 VIEW COMPARISON:  04/05/2018. FINDINGS: Endotracheal tube, NG tube, right IJ line stable position. Heart size stable. Bibasilar atelectasis/infiltrates. Small left pleural effusion. No pneumothorax. IMPRESSION: 1.  Lines and tubes in stable position. 2.  Bibasilar atelectasis/infiltrates.  Small left pleural effusion. Electronically Signed   By: Marcello Moores  Register  On: 04/06/2018 06:55   Dg Chest Port 1 View  Result Date: 04/05/2018 CLINICAL DATA:  Acute respiratory failure. EXAM: PORTABLE CHEST 1 VIEW COMPARISON:  03/17/2018 FINDINGS: Endotracheal tube has tip 6.1 cm above the carina. Nasogastric tube has tip and side-port over the stomach in the left upper quadrant. Right IJ central venous catheter has tip over the SVC and is unchanged. Lungs are adequately inflated with subtle linear bibasilar density likely atelectasis. No effusion. Cardiomediastinal silhouette and remainder of the exam is unchanged. IMPRESSION: Mild bibasilar linear density unchanged likely atelectasis. Tubes and lines as described. Electronically Signed   By: Marin Olp M.D.   On: 04/05/2018 07:13   US Liver Doppler  Result Date: 04/03/2018 CLINICAL DATA:  Elevated LFTs EXAM: DUPLEX ULTRASOUND OF LIVER TECHNIQUE: Color and duplex Doppler ultrasound was performed to evaluate the hepatic in-flow and out-flow vessels. COMPARISON:  04/04/2008 FINDINGS: Liver: Mild increased echogenicity. Grossly normal hepatic contour. Small amount of perihepatic ascites. Slight intrahepatic biliary prominence without definite obstruction pattern. No focal lesion, mass or intrahepatic biliary ductal dilatation. Portal Vein Velocities Main:  40 cm/sec Right:  15 cm/sec Left:  25 cm/sec Hepatic Vein Velocities Right:  21 cm/sec Middle:  27 cm/sec Left:  Not visualized IVC: Present and patent with  normal respiratory phasicity. Hepatic Artery Velocity:  Not visualized Splenic Vein Velocity:  Not visualized Varices: Not visualized Ascites: Small amount of abdominal ascites mild gallbladder wall thickening measuring 4.4 mm, suspect related to chronic hepatic disease. Small gallbladder polyps noted. Trace perihepatic ascites. Small gallstones. Patent portal vein with normal hepatopetal flow. Right and middle hepatic veins are patent. Left hepatic vein and splenic vein not visualized well because of body habitus and ventilatory support. This limits the exam. No gross veno-occlusive process. IMPRESSION: Limited exam but patent portal vein with normal directional flow. Other findings as above. Electronically Signed   By: Jerilynn Mages.  Shick M.D.   On: 04/03/2018 14:32   Vas [place] Lower Extremity Venous (dvt)  Result Date: 04/06/2018  Lower Venous Study Performing Technologist: Antonieta Pert RDMS, RVT  Examination Guidelines: A complete evaluation includes B-mode imaging, spectral Doppler, color Doppler, and power Doppler as needed of all accessible portions of each vessel. Bilateral testing is considered an integral part of a complete examination. Limited examinations for reoccurring indications may be performed as noted.  Right Venous Findings: +---------+---------------+---------+-----------+----------+------------------+          CompressibilityPhasicitySpontaneityPropertiesSummary            +---------+---------------+---------+-----------+----------+------------------+ CFV      Full           Yes      Yes                                     +---------+---------------+---------+-----------+----------+------------------+ SFJ      Full                                                            +---------+---------------+---------+-----------+----------+------------------+ FV Prox  Full                                                             +---------+---------------+---------+-----------+----------+------------------+  FV Mid   Full                                                            +---------+---------------+---------+-----------+----------+------------------+ FV DistalFull                                                            +---------+---------------+---------+-----------+----------+------------------+ PFV      Full                                                            +---------+---------------+---------+-----------+----------+------------------+ POP      Full           Yes      Yes                                     +---------+---------------+---------+-----------+----------+------------------+ PTV      Full                                         poor visualization +---------+---------------+---------+-----------+----------+------------------+ PERO     Full                                         poor visualization +---------+---------------+---------+-----------+----------+------------------+ GSV      Full                                                            +---------+---------------+---------+-----------+----------+------------------+ atherosclerotic changes noted  Left Venous Findings: +---------+---------------+---------+-----------+----------+----------------+          CompressibilityPhasicitySpontaneityPropertiesSummary          +---------+---------------+---------+-----------+----------+----------------+ CFV                                                   Not visualized   +---------+---------------+---------+-----------+----------+----------------+ SFJ                                                   Not visualized   +---------+---------------+---------+-----------+----------+----------------+ FV Prox  Full           Yes      Yes                                    +---------+---------------+---------+-----------+----------+----------------+  FV Mid   Full                                                          +---------+---------------+---------+-----------+----------+----------------+ FV DistalFull                                                          +---------+---------------+---------+-----------+----------+----------------+ PFV                                                   Not visualized   +---------+---------------+---------+-----------+----------+----------------+ POP      Full                                         poor compression +---------+---------------+---------+-----------+----------+----------------+ PTV      Full                                                          +---------+---------------+---------+-----------+----------+----------------+ PERO     Full                                                          +---------+---------------+---------+-----------+----------+----------------+ GSV      Full                                                          +---------+---------------+---------+-----------+----------+----------------+ unable to visualize CFV due to bandages, poor visualization of popliteal vein due to poor patient positioning.    Summary: Right: There is no evidence of deep vein thrombosis in the lower extremity. However, portions of this examination were limited- see technologist comments above. No cystic structure found in the popliteal fossa. Left: There is no evidence of deep vein thrombosis in the lower extremity. However, portions of this examination were limited- see technologist comments above. No cystic structure found in the popliteal fossa.  *See table(s) above for measurements and observations.    Preliminary     Cardiac Studies   Echo 03/24/2018 Study Conclusions  - Left ventricle: The cavity size was normal. Wall thickness was   increased in a pattern of mild  LVH. Systolic function was normal.   The estimated ejection fraction was in the range of 55% to 60%.   Wall motion was normal; there were no regional wall motion   abnormalities. Doppler parameters are consistent with abnormal   left ventricular relaxation (grade 1  diastolic dysfunction). - Aortic valve: There was trivial regurgitation. - Mitral valve: Calcified annulus.  Impressions:  - Technically difficult; normal LV systolic function; mild   diastolic dysfunction; mild LVH; trace AI.   Patient Profile     Craig Mason is a 67 y.o. male skilled nursing home resident with dementia, h/o CVA, DM-T2, respiratory failure with previous tracheostomy who presented w/ AMS,  found to be hypothermic, hypotensive, acute kidney injury with metabolic acidosis and also with a GIB. Cardiology consulted for elevated troponin at the request of Dr. Learta Codding .  Assessment & Plan    1. Elevated Troponin: in the setting of multiorgan failure and GIB. Troponin peaked at 10.75 and trending downward, at 3.79 today. TTE with normal LVEF and normal wall motion. Initial SCr was >7 and CRRT initiated. Hgb near 7. Undergoing GI w/u. Heparin discontinued. Not a candidate for for cath at this time. Suspect Type II NSTEMI.     For questions or updates, please contact New Site Please consult [email or web address] for contact info under        Signed, Lyda Jester, PA-C  04/06/2018, 10:36 AM     Attending Note:   The patient was seen and examined.  Agree with assessment and plan as noted above.  Changes made to the above note as needed.  Patient seen and independently examined with Lyda Jester, PA .   We discussed all aspects of the encounter. I agree with the assessment and plan as stated above.  1.  Elevated troponin: This is in the setting of multiorgan failure and a GI bleed.  Echocardiogram reveals normal left ventricular systolic function.  I suspect this is due to demand ischemia. There is  no role for invasive cardiology work-up at this point.  He has multiorgan failure and we would be severely limited in what were able to do.  In addition, my understanding is that at baseline he has fairly severe dementia and lives in a nursing home. I guess that even he recovers from this acute multiorgan failure, he would not be a good candidate for invasive cardiology procedures.  [facility] will sign off.   Medication Recommendations:  Continue supportive care  Other recommendations (labs, testing, etc):   Follow up as an outpatient:  With his primary MD,   No cardiology follow up needed.     I have spent a total of 40 minutes with patient reviewing hospital  notes , telemetry, EKGs, labs and examining patient as well as establishing an assessment and plan that was discussed with the patient. > 50% of time was spent in direct patient care.    Thayer Headings, Brooke Bonito., MD, [facility] 04/06/2018, 2:36 PM [address],  [place] Pager [phone]

## 2018-04-06 NOTE — Progress Notes (Signed)
Bilateral lower extremity venous duplex complete. Please see CV proc tab for preliminary results. Levin Baconlaire Javonni Macke- RDMS, RVT 10:11 AM  04/06/2018

## 2018-04-07 ENCOUNTER — Inpatient Hospital Stay (HOSPITAL_COMMUNITY): Payer: Medicare (Managed Care)

## 2018-04-07 LAB — RENAL FUNCTION PANEL
Albumin: 1.8 g/dL — ABNORMAL LOW (ref 3.5–5.0)
Albumin: 1.7 g/dL — ABNORMAL LOW (ref 3.5–5.0)
Anion gap: 11 (ref 5–15)
Anion gap: 20 — ABNORMAL HIGH (ref 5–15)
BUN: 30 mg/dL — ABNORMAL HIGH (ref 8–23)
BUN: 30 mg/dL — ABNORMAL HIGH (ref 8–23)
CO2: 23 mmol/L (ref 22–32)
CO2: 13 mmol/L — ABNORMAL LOW (ref 22–32)
Calcium: 7.1 mg/dL — ABNORMAL LOW (ref 8.9–10.3)
Calcium: 7.1 mg/dL — ABNORMAL LOW (ref 8.9–10.3)
Chloride: 104 mmol/L (ref 98–111)
Chloride: 105 mmol/L (ref 98–111)
Creatinine, Ser: 1.8 mg/dL — ABNORMAL HIGH (ref 0.61–1.24)
Creatinine, Ser: 1.99 mg/dL — ABNORMAL HIGH (ref 0.61–1.24)
GFR calc Af Amer: 44 mL/min — ABNORMAL LOW (ref >60)
GFR calc Af Amer: 39 mL/min — ABNORMAL LOW (ref >60)
GFR calc non Af Amer: 38 mL/min — ABNORMAL LOW (ref >60)
GFR calc non Af Amer: 34 mL/min — ABNORMAL LOW (ref >60)
GLUCOSE: 138 mg/dL — AB (ref 70–99)
Glucose, Bld: 219 mg/dL — ABNORMAL HIGH (ref 70–99)
Phosphorus: 2.7 mg/dL (ref 2.5–4.6)
Phosphorus: 2.8 mg/dL (ref 2.5–4.6)
Potassium: 4.8 mmol/L (ref 3.5–5.1)
Potassium: 5.2 mmol/L — ABNORMAL HIGH (ref 3.5–5.1)
Sodium: 138 mmol/L (ref 135–145)
Sodium: 138 mmol/L (ref 135–145)

## 2018-04-07 LAB — MAGNESIUM
MAGNESIUM: 2.4 mg/dL (ref 1.7–2.4)
Magnesium: 2.4 mg/dL (ref 1.7–2.4)
Magnesium: 2.4 mg/dL (ref 1.7–2.4)

## 2018-04-07 LAB — GLUCOSE, CAPILLARY
GLUCOSE-CAPILLARY: 199 mg/dL — AB (ref 70–99)
Glucose-Capillary: 118 mg/dL — ABNORMAL HIGH (ref 70–99)
Glucose-Capillary: 120 mg/dL — ABNORMAL HIGH (ref 70–99)
Glucose-Capillary: 128 mg/dL — ABNORMAL HIGH (ref 70–99)
Glucose-Capillary: 130 mg/dL — ABNORMAL HIGH (ref 70–99)
Glucose-Capillary: 171 mg/dL — ABNORMAL HIGH (ref 70–99)
Glucose-Capillary: 188 mg/dL — ABNORMAL HIGH (ref 70–99)

## 2018-04-07 LAB — PHOSPHORUS
PHOSPHORUS: 3.2 mg/dL (ref 2.5–4.6)
Phosphorus: 2.7 mg/dL (ref 2.5–4.6)
Phosphorus: 3 mg/dL (ref 2.5–4.6)

## 2018-04-07 LAB — CBC
HCT: 22.3 % — ABNORMAL LOW (ref 39.0–52.0)
HEMOGLOBIN: 7.5 g/dL — AB (ref 13.0–17.0)
MCH: 32.6 pg (ref 26.0–34.0)
MCHC: 33.6 g/dL (ref 30.0–36.0)
MCV: 97 fL (ref 80.0–100.0)
Platelets: 48 10*3/uL — ABNORMAL LOW (ref 150–400)
RBC: 2.3 MIL/uL — ABNORMAL LOW (ref 4.22–5.81)
RDW: 14.7 % (ref 11.5–15.5)
WBC: 9.8 10*3/uL (ref 4.0–10.5)
nRBC: 0.4 % — ABNORMAL HIGH (ref 0.0–0.2)

## 2018-04-07 LAB — CULTURE, RESPIRATORY W GRAM STAIN

## 2018-04-07 LAB — CULTURE, RESPIRATORY

## 2018-04-07 LAB — POCT ACTIVATED CLOTTING TIME
Activated Clotting Time: 197 seconds
Activated Clotting Time: 191 seconds
Activated Clotting Time: 191 seconds
Activated Clotting Time: 208 seconds

## 2018-04-07 LAB — PROCALCITONIN: Procalcitonin: 7.72 ng/mL

## 2018-04-07 MED ORDER — PRO-STAT SUGAR FREE PO LIQD
30.0000 mL | Freq: Every day | ORAL | Status: DC
  Administered 2018-04-07 – 2018-04-13 (×7): 30 mL
  Filled 2018-04-07 (×6): qty 30

## 2018-04-07 MED ORDER — ALBUTEROL SULFATE (2.5 MG/3ML) 0.083% IN NEBU: 2.5000 mg | INHALATION_SOLUTION | Freq: Four times a day (QID) | RESPIRATORY_TRACT | Status: DC | PRN

## 2018-04-07 MED ORDER — VITAL HIGH PROTEIN PO LIQD
1000.0000 mL | ORAL | Status: DC
  Administered 2018-04-07 – 2018-04-13 (×7): 1000 mL
  Filled 2018-04-07: qty 1000

## 2018-04-07 MED ORDER — PRISMASOL BGK 0/2.5 32-2.5 MEQ/L IV SOLN
INTRAVENOUS | Status: DC
  Administered 2018-04-07 – 2018-04-09 (×9): via INTRAVENOUS_CENTRAL
  Filled 2018-04-07 (×24): qty 5000

## 2018-04-07 MED ORDER — B COMPLEX-C PO TABS
1.0000 | ORAL_TABLET | Freq: Every day | ORAL | Status: DC
  Administered 2018-04-07 – 2018-04-13 (×7): 1 via ORAL
  Filled 2018-04-07 (×7): qty 1

## 2018-04-07 MED ORDER — SODIUM CHLORIDE 0.9 % IV SOLN
1.0000 g | Freq: Two times a day (BID) | INTRAVENOUS | Status: DC
  Administered 2018-04-07 – 2018-04-10 (×7): 1 g via INTRAVENOUS
  Filled 2018-04-07 (×7): qty 1

## 2018-04-07 NOTE — Progress Notes (Signed)
Nutrition Follow-up  DOCUMENTATION CODES:   Non-severe (moderate) malnutrition in context of chronic illness  INTERVENTION:   Tube Feeding:  Vital High Protein @ 65 ml/hr Pro-Stat 30 mL daily Provides 1660 kcals, 152 g of protein and 1310 mL of free water  Add B-complex with Vitamin C   NUTRITION DIAGNOSIS:   Moderate Malnutrition related to chronic illness(chronic respiratory failure, dementia, FTT) as evidenced by moderate fat depletion, moderate muscle depletion, percent weight loss.  Being addressed via TF   GOAL:   Patient will meet greater than or equal to 90% of their needs  Progressing  MONITOR:   TF tolerance, Vent status, Labs, Weight trends  REASON FOR ASSESSMENT:   Ventilator    ASSESSMENT:    67 yo male admitted with AMS, hypothermia, hypotensive in multi-organ failure with septic shock, AKI with hyperkalemia and metabolic acidosis requiring RRT, intubated. PMH includes DM, respiratory failure requiring trach in the past, dementia. [facility] [place] [facility] [facility] [place] [facility] [place]  Remains on CRRT Patient is currently intubated on ventilator support, failed SBT this AM. Fentanyl for sedation, off pressors MV: 10.1 L/min Temp (24hrs), Avg:97.8 F (36.6 C), Min:97.2 F (36.2 C), Max:98.2 F (36.8 C)  OG tube in stomach. CCM ok with starting TF today  Current wt 80.4 kg; utilizing 75 kg as EDW. Net +5 L per I/O flow sheet  Labs: CBGs 120-130, phosphorus wdl, potassium wdl Meds: reviewed   Diet Order:   Diet Order            Diet NPO time specified  Diet effective now              EDUCATION NEEDS:   Not appropriate for education at this time  Skin:  Skin Assessment: Skin Integrity Issues: Skin Integrity Issues:: Stage II, DTI DTI: heel Stage II: coccyx  Last BM:  12/22  Height:   Ht Readings from Last 1 Encounters:  08/10/2017 5\' 8"  (1.727 m)    Weight:   Wt Readings from  Last 1 Encounters:  04/07/18 80.4 kg   BMI:  Body mass index is 26.95 kg/m.  Estimated Nutritional Needs:   Kcal:  1681 kcals   Protein:  135-165 g  Fluid:  per MD  Romelle Starcherate Nykole Matos MS, RD, LDN, CNSC [phone] Pager  [phone] Weekend/On-Call Pager

## 2018-04-07 NOTE — Care Management Important Message (Signed)
Important Message  Patient Details  Name: Craig Mason MRN: [id number] Date of Birth: 05-30-1950   Medicare Important Message Given:  Yes  Precaution in place no IM given   Dorena BodoIris Monigue Spraggins 04/07/2018, 3:18 PM

## 2018-04-07 NOTE — Progress Notes (Signed)
[facility] ROUNDING NOTE   Subjective:   Patient continues on CRRT.  This appears to have been initiated 03/30/2018  Blood pressure 119/50 pulse 74 temperature 97.3 O2 sats 95% FiO2 40%  IV vasopressin [11.2]  IV cefepime 2 g daily  Minimal urine output continues to be kept even on CRRT  Sodium 138 potassium 4.8 chloride 104 CO2 23 glucose 138 BUN 30 creatinine 1.8 calcium 7.1 albumin 1.8 phosphorus 2.7 magnesium 2.4 WBC 9.8 hemoglobin 7.5 platelets of 48  Chest x-ray worsening aeration noted on chest x-ray 04/07/2018   Objective:  Vital signs in last 24 hours:  Temp:  [97.2 F (36.2 C)-98.2 F (36.8 C)] 97.3 F (36.3 C) (12/24 0845) Pulse Rate:  [64-79] 74 (12/24 0845) Resp:  [16-27] 19 (12/24 0845) BP: (89-131)/(34-75) 109/47 (12/24 0800) SpO2:  [89 %-100 %] 95 % (12/24 0845) Arterial Line BP: (89-125)/(35-46) 110/40 (12/24 0845) FiO2 (%):  [40 %-50 %] 40 % (12/24 0755) Weight:  [80.4 kg] 80.4 kg (12/24 0441)  Weight change: -1.6 kg Filed Weights   04/05/18 0500 04/06/18 0500 04/07/18 0441  Weight: 80.1 kg 82 kg 80.4 kg    Intake/Output: I/O last 3 completed shifts: In: 3925.6 [I.V.:2759; Blood:345; NG/GT:235; IV Piggyback:586.6] Out: 4287 [Urine:112; Emesis/NG output:75; GOTLX:7262]   Intake/Output this shift:  Total I/O In: 71.5 [I.V.:56.5; NG/GT:15] Out: 95 [Other:95]  CVS- RRR JVP not elevated no room murmurs rubs or gallops RS- CTA diminished breath sounds at bases ABD- BS present soft non-distended EXT- no edema   Basic Metabolic Panel: Recent Labs  Lab 03/16/2018 0145 04/03/2018 0249  04/05/18 0315 04/05/18 1426 04/06/18 0414 04/06/18 1613 04/07/18 0310 04/07/18 0312  NA 144 147*   < > 140  140 138 137 139 138  --   K 7.3* 7.0*   < > 4.3  4.4 4.5 4.4 5.0 4.8  --   CL 108 108   < > 87*  87* 97* 102 105 104  --   CO2 <7* <7*   < > 23  26 19* '24 24 23  ' --   GLUCOSE 252* 268*   < > 127*  126* 182* 135* 172* 138*  --   BUN  103* 98*   < > 56*  55* 44* 36* 33* 30*  --   CREATININE 7.68* 7.15*   < > 3.77*  3.78* 2.81* 2.26* 2.02* 1.80*  --   CALCIUM 7.6* 7.2*   < > 5.9*  6.0* 6.4* 6.8* 7.0* 7.1*  --   MG 2.2 2.1  --  1.6*  --  2.2  --   --  2.4  PHOS 13.6* 12.8*   < > 3.6  3.6 4.0 3.1 3.1 2.7 2.7   < > = values in this interval not displayed.    Liver Function Tests: Recent Labs  Lab 03/31/2018 0145 04/03/2018 0830  04/05/18 0315 04/05/18 1426 04/06/18 0414 04/06/18 1613 04/07/18 0310  AST 258* 232*  --  141*  --  110*  --   --   ALT 123* 118*  --  86*  --  69*  --   --   ALKPHOS 129* 124  --  92  --  76  --   --   BILITOT 1.8* 2.3*  --  2.1*  --  1.5*  --   --   PROT 4.8* 4.4*  --  4.3*  --  4.4*  --   --   ALBUMIN 2.2* 2.2*   < >  2.1*  2.0* 2.0* 1.9* 1.8* 1.8*   < > = values in this interval not displayed.   No results for input(s): LIPASE, AMYLASE in the last 168 hours. Recent Labs  Lab 04/09/2018 0430 03/29/2018 1709  AMMONIA 63* 29    CBC: Recent Labs  Lab 03/27/2018 0145  04/05/18 0412 04/05/18 1430 04/05/18 2013 04/06/18 0414 04/06/18 1700 04/06/18 2141 04/07/18 0312  WBC 27.6*   < > 14.6* 12.0*  --  5.9  --  10.9* 9.8  NEUTROABS 21.5*  --   --   --   --   --   --   --   --   HGB 11.2*   < > 8.4* 7.2* 7.1* 7.1* 6.4* 7.4* 7.5*  HCT 39.2   < > 24.1* 22.3* 21.9* 22.0* 19.6* 22.3* 22.3*  MCV 114.0*   < > 96.8 100.0  --  98.7  --  97.4 97.0  PLT 273   < > 107* 83*  --  68*  --  46* 48*   < > = values in this interval not displayed.    Cardiac Enzymes: Recent Labs  Lab 03/30/2018 1513 04/12/2018 2028 04/05/18 0315 04/05/18 0819 04/06/18 0624  TROPONINI 4.78* 9.41* 10.75* 10.26* 3.79*    BNP: Invalid input(s): POCBNP  CBG: Recent Labs  Lab 04/06/18 1540 04/06/18 2020 04/07/18 0016 04/07/18 0438 04/07/18 0808  GLUCAP 154* 152* 130* 120* 128*    Microbiology: Results for orders placed or performed during the hospital encounter of 04/09/2018  MRSA PCR Screening      Status: None   Collection Time: 03/24/2018 12:59 AM  Result Value Ref Range Status   MRSA by PCR NEGATIVE NEGATIVE Final    Comment:        The GeneXpert MRSA Assay (FDA approved for NASAL specimens only), is one component of a comprehensive MRSA colonization surveillance program. It is not intended to diagnose MRSA infection nor to guide or monitor treatment for MRSA infections. Performed at [facility], [place] [address], [place], [place] [address]   Culture, blood (routine x 2)     Status: None (Preliminary result)   Collection Time: 04/06/2018  1:35 AM  Result Value Ref Range Status   Specimen Description BLOOD LEFT HAND  Final   Special Requests   Final    BOTTLES DRAWN AEROBIC AND ANAEROBIC Blood Culture adequate volume   Culture   Final    NO GROWTH 3 DAYS Performed at [facility], [place] [address] [place], [place] [address]    Report Status PENDING  Incomplete  Culture, blood (routine x 2)     Status: None (Preliminary result)   Collection Time: 04/05/2018  2:23 AM  Result Value Ref Range Status   Specimen Description SITE NOT SPECIFIED  Final   Special Requests   Final    BOTTLES DRAWN AEROBIC AND ANAEROBIC Blood Culture adequate volume   Culture   Final    NO GROWTH 3 DAYS Performed at [facility], [address] [place], [place] [address]    Report Status PENDING  Incomplete  Culture, respiratory (tracheal aspirate)     Status: None (Preliminary result)   Collection Time: 04/05/18  7:12 AM  Result Value Ref Range Status   Specimen Description TRACHEAL ASPIRATE  Final   Special Requests NONE  Final   Gram Stain   Final    FEW WBC PRESENT,BOTH PMN AND MONONUCLEAR MODERATE GRAM NEGATIVE RODS FEW GRAM POSITIVE COCCI  IN CHAINS RARE GRAM POSITIVE RODS    Culture   Final    MODERATE KLEBSIELLA PNEUMONIAE SUSCEPTIBILITIES TO FOLLOW Performed at [facility], [place] [address], [place], [place] [address]    Report Status PENDING  Incomplete     Coagulation Studies: Recent Labs    04/05/18 1648  LABPROT 22.1*  INR 1.96    Urinalysis: Recent Labs    03/15/2018 1955  COLORURINE YELLOW  LABSPEC >1.030*  PHURINE 5.0  GLUCOSEU NEGATIVE  HGBUR [place] >300*  NITRITE NEGATIVE  LEUKOCYTESUR NEGATIVE      Imaging: Dg Chest Port 1 View  Result Date: 04/07/2018 CLINICAL DATA:  Acute respiratory failure. EXAM: PORTABLE CHEST 1 VIEW COMPARISON:  04/06/2018. FINDINGS: Unchanged mild cardiac enlargement. Support tubes and lines are stable. Increasing BILATERAL lower lobe opacities representing either pneumonia or edema. BILATERAL effusions appear increased. No pneumothorax. IMPRESSION: Worsening aeration. Electronically Signed   By: Staci Righter M.D.   On: 04/07/2018 07:20   Dg Chest Port 1 View  Result Date: 04/06/2018 CLINICAL DATA:  Acute respiratory failure.  Shortness of breath. EXAM: PORTABLE CHEST 1 VIEW COMPARISON:  04/05/2018. FINDINGS: Endotracheal tube, NG tube, right IJ line stable position. Heart size stable. Bibasilar atelectasis/infiltrates. Small left pleural effusion. No pneumothorax. IMPRESSION: 1.  Lines and tubes in stable position. 2.  Bibasilar atelectasis/infiltrates.  Small left pleural effusion. Electronically Signed   By: Marcello Moores  Register   On: 04/06/2018 06:55   Vas [place] Lower Extremity Venous (dvt)  Result Date: 04/06/2018  Lower Venous Study Performing Technologist: Antonieta Pert RDMS, RVT  Examination Guidelines: A complete evaluation includes B-mode imaging, spectral Doppler, color Doppler, and power Doppler as needed of all accessible portions of each vessel. Bilateral testing is considered an integral part of a complete examination. Limited examinations for reoccurring indications may be performed as noted.  Right Venous Findings: +---------+---------------+---------+-----------+----------+------------------+           CompressibilityPhasicitySpontaneityPropertiesSummary            +---------+---------------+---------+-----------+----------+------------------+ CFV      Full           Yes      Yes                                     +---------+---------------+---------+-----------+----------+------------------+ SFJ      Full                                                            +---------+---------------+---------+-----------+----------+------------------+ FV Prox  Full                                                            +---------+---------------+---------+-----------+----------+------------------+ FV Mid   Full                                                            +---------+---------------+---------+-----------+----------+------------------+  FV DistalFull                                                            +---------+---------------+---------+-----------+----------+------------------+ PFV      Full                                                            +---------+---------------+---------+-----------+----------+------------------+ POP      Full           Yes      Yes                                     +---------+---------------+---------+-----------+----------+------------------+ PTV      Full                                         poor visualization +---------+---------------+---------+-----------+----------+------------------+ PERO     Full                                         poor visualization +---------+---------------+---------+-----------+----------+------------------+ GSV      Full                                                            +---------+---------------+---------+-----------+----------+------------------+ atherosclerotic changes noted  Left Venous Findings: +---------+---------------+---------+-----------+----------+----------------+          CompressibilityPhasicitySpontaneityPropertiesSummary           +---------+---------------+---------+-----------+----------+----------------+ CFV                                                   Not visualized   +---------+---------------+---------+-----------+----------+----------------+ SFJ                                                   Not visualized   +---------+---------------+---------+-----------+----------+----------------+ FV Prox  Full           Yes      Yes                                   +---------+---------------+---------+-----------+----------+----------------+ FV Mid   Full                                                          +---------+---------------+---------+-----------+----------+----------------+  FV DistalFull                                                          +---------+---------------+---------+-----------+----------+----------------+ PFV                                                   Not visualized   +---------+---------------+---------+-----------+----------+----------------+ POP      Full                                         poor compression +---------+---------------+---------+-----------+----------+----------------+ PTV      Full                                                          +---------+---------------+---------+-----------+----------+----------------+ PERO     Full                                                          +---------+---------------+---------+-----------+----------+----------------+ GSV      Full                                                          +---------+---------------+---------+-----------+----------+----------------+ unable to visualize CFV due to bandages, poor visualization of popliteal vein due to poor patient positioning.    Summary: Right: There is no evidence of deep vein thrombosis in the lower extremity. However, portions of this examination were limited- see technologist comments above. No cystic  structure found in the popliteal fossa. Left: There is no evidence of deep vein thrombosis in the lower extremity. However, portions of this examination were limited- see technologist comments above. No cystic structure found in the popliteal fossa.  *See table(s) above for measurements and observations. Electronically signed by Curt Jews MD on 04/06/2018 at 1:45:17 PM.    Final      Medications:   .  prismasol BGK 4/2.5 300 mL/hr at 04/07/18 0759  .  prismasol BGK 4/2.5 300 mL/hr at 04/07/18 0756  . sodium chloride Stopped (04/05/18 0625)  . sodium chloride    . ceFEPime (MAXIPIME) IV 2 g (04/07/18 0803)  . fentaNYL infusion INTRAVENOUS 50 mcg/hr (04/07/18 0800)  . heparin 10,000 units/ 20 mL infusion syringe 1,000 Units/hr (04/07/18 0535)  . heparin 999 mL/hr at 03/24/2018 0444  . pantoprozole (PROTONIX) infusion 8 mg/hr (04/07/18 0800)  . phenylephrine (NEO-SYNEPHRINE) Adult infusion Stopped (04/06/18 1543)  . prismasol BGK 4/2.5 1,500 mL/hr at 04/07/18 0606  . vancomycin Stopped (04/06/18 1801)  . vasopressin (PITRESSIN) infusion - *FOR SHOCK* Stopped (04/06/18 1508)   . sodium chloride  Intravenous Once  . sodium chloride   Intravenous Once  . aspirin  81 mg Per Tube Daily  . atorvastatin  80 mg Per Tube q1800  . chlorhexidine gluconate (MEDLINE KIT)  15 mL Mouth Rinse BID  . Chlorhexidine Gluconate Cloth  6 each Topical Daily  . donepezil  10 mg Oral QHS  . hydrocortisone sod succinate (SOLU-CORTEF) inj  50 mg Intravenous Q6H  . insulin aspart  0-15 Units Subcutaneous Q4H  . mouth rinse  15 mL Mouth Rinse 10 times per day  . [START ON 04/08/2018] pantoprazole  40 mg Intravenous Q12H  . sodium chloride flush  10-40 mL Intracatheter Q12H   Place/Maintain arterial line **AND** sodium chloride, bisacodyl, docusate, fentaNYL, heparin, heparin, heparin, midazolam, midazolam, sennosides, sodium chloride flush  Assessment/ Plan:   Acute kidney injury presumed no data prior severe  hypertension pressors CRRT initiated 04/11/2018.  Hypertension/volume appears to be hypotensive requiring pressors will remove 50 cc an hour with dialysis.  On CRRT  Hyperkalemia metabolic acidosis supportive care appears to have improved  Anemia was using heparin with CRRT will continue to follow  Electrolytes appear to be stable  Prognosis poor currently DNR status  GI bleed appreciate gastroenterology input anticoagulation having been stopped IV Protonix started.  Appreciate help from Dr. Tarri Glenn  History of CVA CT head without contrast 04/03/2018 atrophy with mild small vessel ischemic changes of white matter  History of dementia  Echo 6431 diastolic dysfunction  Septic shock continues on vancomycin and cefepime.  Blood cultures no growth.  Tracheal aspirate 04/05/2018 moderate Klebsiella pneumonia    LOS: 3 Sherril Croon '@TODAY' '@9' :21 AM

## 2018-04-07 NOTE — Progress Notes (Addendum)
[facility] Progress Note  CC:  GI bleed  Subjective:  Remains intubated.  No blood per NG since last evening.  No longer on pressors but still on CRRT.  Not on heparin gtt, just getting heparin for CRRT.  Objective:  Vital signs in last 24 hours: Temp:  [96.3 F (35.7 C)-98.2 F (36.8 C)] 97.5 F (36.4 C) (12/24 1215) Pulse Rate:  [64-79] 75 (12/24 1227) Resp:  [16-27] 25 (12/24 1227) BP: (89-119)/(34-70) 113/70 (12/24 1227) SpO2:  [89 %-100 %] 94 % (12/24 1227) Arterial Line BP: (89-114)/(35-42) 114/39 (12/24 1215) FiO2 (%):  [40 %-50 %] 40 % (12/24 1227) Weight:  [80.4 kg] 80.4 kg (12/24 0441) Last BM Date: 04/05/18 General:  Critically ill-looking WM. Heart:  Regular rate and rhythm; no murmurs Pulm:  On vent.  Mechanical sounds noted. Abdomen:  Soft, non-distended.  BS present. Extremities:  Some upper extremity edema noted. Neurologic:  Intubated and sedated.  Intake/Output from previous day: 12/23 0701 - 12/24 0700 In: 2055.4 [I.V.:1152.3; Blood:345; NG/GT:205; IV Piggyback:353] Out: 2252 [Urine:75; Emesis/NG output:25] Intake/Output this shift: Total I/O In: 429.7 [I.V.:154.7; NG/GT:75; IV Piggyback:200] Out: 387 [Other:387]  Lab Results: Recent Labs    04/06/18 0414 04/06/18 1700 04/06/18 2141 04/07/18 0312  WBC 5.9  --  10.9* 9.8  HGB 7.1* 6.4* 7.4* 7.5*  HCT 22.0* 19.6* 22.3* 22.3*  PLT 68*  --  46* 48*   BMET Recent Labs    04/06/18 0414 04/06/18 1613 04/07/18 0310  NA 137 139 138  K 4.4 5.0 4.8  CL 102 105 104  CO2 24 24 23   GLUCOSE 135* 172* 138*  BUN 36* 33* 30*  CREATININE 2.26* 2.02* 1.80*  CALCIUM 6.8* 7.0* 7.1*   LFT Recent Labs    04/06/18 0414  04/07/18 0310  PROT 4.4*  --   --   ALBUMIN 1.9*   < > 1.8*  AST 110*  --   --   ALT 69*  --   --   ALKPHOS 76  --   --   BILITOT 1.5*  --   --    < > = values in this interval not displayed.   PT/INR Recent Labs    04/05/18 1648  LABPROT 22.1*  INR 1.96     Dg Chest Port 1 View  Result Date: 04/07/2018 CLINICAL DATA:  Acute respiratory failure. EXAM: PORTABLE CHEST 1 VIEW COMPARISON:  04/06/2018. FINDINGS: Unchanged mild cardiac enlargement. Support tubes and lines are stable. Increasing BILATERAL lower lobe opacities representing either pneumonia or edema. BILATERAL effusions appear increased. No pneumothorax. IMPRESSION: Worsening aeration. Electronically Signed   By: Elsie Stain M.D.   On: 04/07/2018 07:20   Dg Chest Port 1 View  Result Date: 04/06/2018 CLINICAL DATA:  Acute respiratory failure.  Shortness of breath. EXAM: PORTABLE CHEST 1 VIEW COMPARISON:  04/05/2018. FINDINGS: Endotracheal tube, NG tube, right IJ line stable position. Heart size stable. Bibasilar atelectasis/infiltrates. Small left pleural effusion. No pneumothorax. IMPRESSION: 1.  Lines and tubes in stable position. 2.  Bibasilar atelectasis/infiltrates.  Small left pleural effusion. Electronically Signed   By: Maisie Fus  Register   On: 04/06/2018 06:55   Vas [place] Lower Extremity Venous (dvt)  Result Date: 04/06/2018  Lower Venous Study Performing Technologist: Levada Schilling RDMS, RVT  Examination Guidelines: A complete evaluation includes B-mode imaging, spectral Doppler, color Doppler, and power Doppler as needed of all accessible portions of each vessel. Bilateral testing is considered an integral part of a complete  examination. Limited examinations for reoccurring indications may be performed as noted.  Right Venous Findings: +---------+---------------+---------+-----------+----------+------------------+          CompressibilityPhasicitySpontaneityPropertiesSummary            +---------+---------------+---------+-----------+----------+------------------+ CFV      Full           Yes      Yes                                     +---------+---------------+---------+-----------+----------+------------------+ SFJ      Full                                                             +---------+---------------+---------+-----------+----------+------------------+ FV Prox  Full                                                            +---------+---------------+---------+-----------+----------+------------------+ FV Mid   Full                                                            +---------+---------------+---------+-----------+----------+------------------+ FV DistalFull                                                            +---------+---------------+---------+-----------+----------+------------------+ PFV      Full                                                            +---------+---------------+---------+-----------+----------+------------------+ POP      Full           Yes      Yes                                     +---------+---------------+---------+-----------+----------+------------------+ PTV      Full                                         poor visualization +---------+---------------+---------+-----------+----------+------------------+ PERO     Full                                         poor visualization +---------+---------------+---------+-----------+----------+------------------+ GSV      Full                                                            +---------+---------------+---------+-----------+----------+------------------+  atherosclerotic changes noted  Left Venous Findings: +---------+---------------+---------+-----------+----------+----------------+          CompressibilityPhasicitySpontaneityPropertiesSummary          +---------+---------------+---------+-----------+----------+----------------+ CFV                                                   Not visualized   +---------+---------------+---------+-----------+----------+----------------+ SFJ                                                   Not visualized    +---------+---------------+---------+-----------+----------+----------------+ FV Prox  Full           Yes      Yes                                   +---------+---------------+---------+-----------+----------+----------------+ FV Mid   Full                                                          +---------+---------------+---------+-----------+----------+----------------+ FV DistalFull                                                          +---------+---------------+---------+-----------+----------+----------------+ PFV                                                   Not visualized   +---------+---------------+---------+-----------+----------+----------------+ POP      Full                                         poor compression +---------+---------------+---------+-----------+----------+----------------+ PTV      Full                                                          +---------+---------------+---------+-----------+----------+----------------+ PERO     Full                                                          +---------+---------------+---------+-----------+----------+----------------+ GSV      Full                                                          +---------+---------------+---------+-----------+----------+----------------+  unable to visualize CFV due to bandages, poor visualization of popliteal vein due to poor patient positioning.    Summary: Right: There is no evidence of deep vein thrombosis in the lower extremity. However, portions of this examination were limited- see technologist comments above. No cystic structure found in the popliteal fossa. Left: There is no evidence of deep vein thrombosis in the lower extremity. However, portions of this examination were limited- see technologist comments above. No cystic structure found in the popliteal fossa.  *See table(s) above for measurements and observations. Electronically signed  by Gretta Beganodd Early MD on 04/06/2018 at 1:45:17 PM.    Final    Assessment / Plan: *GI bleed in setting of IV heparin.  Blood per NG tube, hematochezia. Heparin gtt discontinued 12/22 at 0700,  but is receiving Heparin prn into CRRT catheter.  Protonix GGT in place.  *Anemia.  Baseline Hgb not known, but macrocytic with MCV 104 at admission.  Did receive 2 units PRCB's on 12/23.  Hgb stable this AM.  *Abnormal LFTs.  Improved.  25-Aug-2017 ultrasound abdomen limited study due to patient condition.  Possible gallstone.  Probable fatty liver.  4 mm CBD.  *Acute thrombocytopenia:  48K this AM.  *AKI in setting of septic shock, hypotension requiring pressors.  CRRT started 12/21.  Renal function improving.  Source of sepsis unclear, no growth on pancultures.  Empiric vancomycin, doxepin in place.  GFR improved.  Lactic acidosis improved.  Remains relatively hypotensive though overall blood pressures improved.  *Elevated troponins 10.26 >> 3.79.    *Baseline dementia.  Diminished AMS over the last week PTA.  At baseline he is oriented x 2 and conversant.  -Monitor Hgb and transfuse prn. -Continue PPI gtt for now. -No plans for EGD currently.  Will follow peripherally for now.   LOS: 3 days   Princella PellegriniJessica D. Rett Stehlik  04/07/2018, 12:50 PM

## 2018-04-07 NOTE — Progress Notes (Signed)
Pharmacy Antibiotic Note  Craig Mason is a 67 y.o. male admitted on 2017/07/05 with AMS, ARF, and septic shock.  Currently on IV vancomycin and Cefepime but respiratory culture grew out ESBL klebsiella pneumoniae. WBC has normalized. Hypothermic on CRRT.    Plan: -Stop cefepime. Start meropenem 1 gm IV Q 12 hours  -Vancomycin 750 mg IV Q 24 hours. -Monitor CBC, renal fx, cultures and clinical progress   Height: 5\' 8"  (172.7 cm) Weight: 177 lb 4 oz (80.4 kg) IBW/kg (Calculated) : 68.4  Temp (24hrs), Avg:97.8 F (36.6 C), Min:97.2 F (36.2 C), Max:98.2 F (36.8 C)  Recent Labs  Lab 05-25-17 0356 05-25-17 0830  05-25-17 1513  04/05/18 0315 04/05/18 0412 04/05/18 0630 04/05/18 1426 04/05/18 1430 04/06/18 0414 04/06/18 0624 04/06/18 1613 04/06/18 2141 04/07/18 0310 04/07/18 0312  WBC  --   --    < >  --   --   --  14.6*  --   --  12.0* 5.9  --   --  10.9*  --  9.8  CREATININE  --  6.22*   < >  --    < > 3.77*  3.78*  --   --  2.81*  --  2.26*  --  2.02*  --  1.80*  --   LATICACIDVEN 12.1* 9.0*  --  7.6*  --   --   --  4.1*  --   --   --  1.7  --   --   --   --    < > = values in this interval not displayed.    Estimated Creatinine Clearance: 38 mL/min (A) (by C-G formula based on SCr of 1.8 mg/dL (H)).    No Known Allergies  Vinnie LevelBenjamin Deval Mroczka, PharmD., BCPS Clinical Pharmacist Clinical phone for 04/07/18 until 3:30pm: [phone] If after 3:30pm, please refer to [facility] for unit-specific pharmacist

## 2018-04-07 NOTE — Progress Notes (Signed)
NAME:  Craig Mason, MRN:  [id number], DOB:  [birth year], LOS: 3 ADMISSION DATE:  03/17/2018, CHIEF COMPLAINT:  Altered mental status  History of present illness   67 year old man, former smoker, with dementia, CVA, T2DM, history of respiratory failure 2/2 pneumonia with previous trach (decannulated now), GERD presenting with altered mental status. [facility]. Sister noted that his mental status had changed for the last week - less conversant and interactive.  He had acute worsening of mental status on day of admission with episode of emesis. At baseline he is conversant and oriented x 2 (gets confused about month).  Found to be in multiorgan failure.  Intubated at [facility] and [facility] for further management.    Past Medical History  Former smoker, with dementia, history CVA, T2DM, history of respiratory failure 2/2 pneumonia with previous trach (decannulated now), GERD, osteoporosis.  Significant Hospital Events   12/21 admission, vas cath placement and CRRT initiated.  Consults:  Nephrology 12/21 Cardiology 12/22 GI 12/22  Procedures:  Rt IJ vas cath placement 12/21  Rt radial A line 12/20 > 12/21 Rt fem A line 12/21 >  Lt fem CVL 12/20 >  Significant Diagnostic Tests:  CT head w/o contrast on 12/20: No CT evidence for acute intracranial abnormality. Atrophy and mild small vessel ischemic changes of the white matter.  Echocardiogram 12/21-mild LVH, EF 55-60%, no regional wall motion abnormality, grade 1 diastolic dysfunction.  Chest x-ray 12/23-lines and tubes in stable position, bibasal atelectasis. LE duplex 12/23 > no DVT; though CFV not visualized due to bandages and poor visualization of popliteals.  Micro Data:  BCx 12/21 No growth Resp Cx 12/21 GPC, GPR  Antimicrobials:  Vanc 12/21 > Cefepime 12/21 >   Interim history/subjective:  Comfortable on vent.  Off all pressors. Attempted SBT this AM, but failed due to  bradycardia.  Objective   Blood pressure (!) 109/47, pulse 74, temperature (!) 97.3 F (36.3 C), resp. rate 19, height 5\' 8"  (1.727 m), weight 80.4 kg, SpO2 95 %.    Vent Mode: PRVC FiO2 (%):  [40 %-50 %] 40 % Set Rate:  [18 bmp] 18 bmp Vt Set:  [430 mL] 430 mL PEEP:  [5 cmH20] 5 cmH20 Pressure Support:  [5 cmH20] 5 cmH20 Plateau Pressure:  [4 cmH20-15 cmH20] 4 cmH20   Intake/Output Summary (Last 24 hours) at 04/07/2018 0905 Last data filed at 04/07/2018 0800 Gross per 24 hour  Intake 1729.9 ml  Output 1943 ml  Net -213.1 ml   Filed Weights   04/05/18 0500 04/06/18 0500 04/07/18 0441  Weight: 80.1 kg 82 kg 80.4 kg    Examination:  General: Adult male, resting in bed, in NAD. Neuro: Sedated, not following commands. HEENT: [place]/AT. Sclerae anicteric, ETT in place. Cardiovascular: RRR, no M/R/G.  Lungs: Respirations even and unlabored.  CTA bilaterally, No W/R/R. Abdomen: BS x 4, soft, NT/ND.  Musculoskeletal: No gross deformities, no edema.  Skin: Intact, warm, no rashes.   Assessment & Plan:   Multiorgan failure Possible septic shock with lactic acidosis - unclear source of infection.  Now off all pressors Continue stress dose steroids Follow cultures, procalcitonin  Elevated troponins, NSTEMI Appreciate consult from cardiology Continue supportive care for now Not a candidate for heart cath due to GI bleed and renal failure  GI bleed Follow CBC.  Transfuse for hemoglobin less than 7 Continue PPI drip GI on board  Thrombocytopenia Follow platelet counts Monitor for signs of bleeding  Acute  kidney injury Hyperkalemia and metabolic acidosis with increased anion gap > resolved  Continue CRRT  Diabetes, hyperglycemia SSI coverage  Elevated LFTs, ammonia Likely from shock liver.  Follow labs. Right upper quadrant ultrasound is unremarkable  Best practice:  Diet: NPO Pain/Anxiety/Delirium protocol (if indicated): Fentanyl gtt, versed prn. RASS goal 0 VAP  protocol (if indicated): ordered DVT prophylaxis: SCDs GI prophylaxis: PPI drip Glucose control: SSI Mobility: Bed rest Code Status: DNR confirmed with sister.  Family Communication: Sister is healthcare POA. Updated her and brother in law at bedside.  Disposition: MICU   CC time: 35 min.   Rutherford Guysahul Dala Breault, [place]PA - C [place] [facility] Pager: [phone]  or [phone] 04/07/2018, 9:35 AM

## 2018-04-07 NOTE — Progress Notes (Signed)
Pt placed back on full vent support due to HR dropped to 44.  RN notified.

## 2018-04-08 ENCOUNTER — Inpatient Hospital Stay (HOSPITAL_COMMUNITY): Payer: Medicare (Managed Care)

## 2018-04-08 LAB — CBC
HCT: 21.6 % — ABNORMAL LOW (ref 39.0–52.0)
Hemoglobin: 7.1 g/dL — ABNORMAL LOW (ref 13.0–17.0)
MCH: 33 pg (ref 26.0–34.0)
MCHC: 32.9 g/dL (ref 30.0–36.0)
MCV: 100.5 fL — ABNORMAL HIGH (ref 80.0–100.0)
PLATELETS: 58 10*3/uL — AB (ref 150–400)
RBC: 2.15 MIL/uL — ABNORMAL LOW (ref 4.22–5.81)
RDW: 15.3 % (ref 11.5–15.5)
WBC: 11 10*3/uL — ABNORMAL HIGH (ref 4.0–10.5)
nRBC: 0 % (ref 0.0–0.2)

## 2018-04-08 LAB — BASIC METABOLIC PANEL
Anion gap: 16 — ABNORMAL HIGH (ref 5–15)
BUN: 28 mg/dL — ABNORMAL HIGH (ref 8–23)
CO2: 17 mmol/L — ABNORMAL LOW (ref 22–32)
Calcium: 7.1 mg/dL — ABNORMAL LOW (ref 8.9–10.3)
Chloride: 103 mmol/L (ref 98–111)
Creatinine, Ser: 1.78 mg/dL — ABNORMAL HIGH (ref 0.61–1.24)
GFR calc Af Amer: 44 mL/min — ABNORMAL LOW (ref >60)
GFR calc non Af Amer: 38 mL/min — ABNORMAL LOW (ref >60)
Glucose, Bld: 239 mg/dL — ABNORMAL HIGH (ref 70–99)
Potassium: 4.3 mmol/L (ref 3.5–5.1)
Sodium: 136 mmol/L (ref 135–145)

## 2018-04-08 LAB — BPAM RBC
Blood Product Expiration Date: 202001042359
Blood Product Expiration Date: 202001172359
ISSUE DATE / TIME: 201912231812
Unit Type and Rh: 5100
Unit Type and Rh: 5100

## 2018-04-08 LAB — MAGNESIUM
Magnesium: 2.4 mg/dL (ref 1.7–2.4)
Magnesium: 2.3 mg/dL (ref 1.7–2.4)

## 2018-04-08 LAB — RENAL FUNCTION PANEL
ANION GAP: 11 (ref 5–15)
Albumin: 1.7 g/dL — ABNORMAL LOW (ref 3.5–5.0)
Albumin: 1.5 g/dL — ABNORMAL LOW (ref 3.5–5.0)
Anion gap: 17 — ABNORMAL HIGH (ref 5–15)
BUN: 28 mg/dL — ABNORMAL HIGH (ref 8–23)
BUN: 31 mg/dL — ABNORMAL HIGH (ref 8–23)
CO2: 17 mmol/L — ABNORMAL LOW (ref 22–32)
CO2: 20 mmol/L — ABNORMAL LOW (ref 22–32)
Calcium: 7 mg/dL — ABNORMAL LOW (ref 8.9–10.3)
Calcium: 6.8 mg/dL — ABNORMAL LOW (ref 8.9–10.3)
Chloride: 103 mmol/L (ref 98–111)
Chloride: 105 mmol/L (ref 98–111)
Creatinine, Ser: 1.78 mg/dL — ABNORMAL HIGH (ref 0.61–1.24)
Creatinine, Ser: 1.52 mg/dL — ABNORMAL HIGH (ref 0.61–1.24)
GFR calc Af Amer: 44 mL/min — ABNORMAL LOW (ref >60)
GFR calc Af Amer: 54 mL/min — ABNORMAL LOW (ref >60)
GFR calc non Af Amer: 38 mL/min — ABNORMAL LOW (ref >60)
GFR calc non Af Amer: 46 mL/min — ABNORMAL LOW (ref >60)
GLUCOSE: 306 mg/dL — AB (ref 70–99)
Glucose, Bld: 241 mg/dL — ABNORMAL HIGH (ref 70–99)
Phosphorus: 2.6 mg/dL (ref 2.5–4.6)
Phosphorus: 1.7 mg/dL — ABNORMAL LOW (ref 2.5–4.6)
Potassium: 4.3 mmol/L (ref 3.5–5.1)
Potassium: 3.9 mmol/L (ref 3.5–5.1)
Sodium: 137 mmol/L (ref 135–145)
Sodium: 136 mmol/L (ref 135–145)

## 2018-04-08 LAB — TYPE AND SCREEN
ABO/RH(D): O POS
Antibody Screen: NEGATIVE
Unit division: 0
Unit division: 0

## 2018-04-08 LAB — GLUCOSE, CAPILLARY
GLUCOSE-CAPILLARY: 258 mg/dL — AB (ref 70–99)
Glucose-Capillary: 194 mg/dL — ABNORMAL HIGH (ref 70–99)
Glucose-Capillary: 207 mg/dL — ABNORMAL HIGH (ref 70–99)
Glucose-Capillary: 238 mg/dL — ABNORMAL HIGH (ref 70–99)
Glucose-Capillary: 290 mg/dL — ABNORMAL HIGH (ref 70–99)

## 2018-04-08 LAB — POCT I-STAT 3, ART BLOOD GAS (G3+)
Acid-base deficit: 7 mmol/L — ABNORMAL HIGH (ref 0.0–2.0)
Bicarbonate: 17.9 mmol/L — ABNORMAL LOW (ref 20.0–28.0)
O2 Saturation: 96 %
Patient temperature: 36.1
TCO2: 19 mmol/L — ABNORMAL LOW (ref 22–32)
pCO2 arterial: 30.2 mmHg — ABNORMAL LOW (ref 32.0–48.0)
pH, Arterial: 7.377 (ref 7.350–7.450)
pO2, Arterial: 79 mmHg — ABNORMAL LOW (ref 83.0–108.0)

## 2018-04-08 LAB — PHOSPHORUS
Phosphorus: 2.6 mg/dL (ref 2.5–4.6)
Phosphorus: 1.8 mg/dL — ABNORMAL LOW (ref 2.5–4.6)

## 2018-04-08 LAB — POCT ACTIVATED CLOTTING TIME
Activated Clotting Time: 191 seconds
Activated Clotting Time: 208 seconds
Activated Clotting Time: 208 seconds

## 2018-04-08 MED ORDER — INSULIN GLARGINE 100 UNIT/ML ~~LOC~~ SOLN
20.0000 [IU] | Freq: Every day | SUBCUTANEOUS | Status: DC
  Administered 2018-04-08 – 2018-04-09 (×2): 20 [IU] via SUBCUTANEOUS
  Filled 2018-04-08 (×2): qty 0.2

## 2018-04-08 MED ORDER — PANTOPRAZOLE SODIUM 40 MG IV SOLR: 40.0000 mg | INTRAVENOUS | Status: DC

## 2018-04-08 MED ORDER — HYDROCORTISONE NA SUCCINATE PF 100 MG IJ SOLR
50.0000 mg | Freq: Two times a day (BID) | INTRAMUSCULAR | Status: DC
  Administered 2018-04-08 – 2018-04-09 (×2): 50 mg via INTRAVENOUS
  Filled 2018-04-08 (×2): qty 2

## 2018-04-08 NOTE — Progress Notes (Signed)
[facility] ROUNDING NOTE   Subjective:   Patient continues on CRRT.  This appears to have been initiated 03/17/2018.  No change.  Appreciate help from Dr. Patel, changed dialysate to 2K from 4K due to hyperkalemia.  Pressors appear to have been discontinued.  Blood pressure 100/57 pulse 65 temperature 96.6 O2 sats 98% FiO2 40%  IV vancomycin IV meropenem  Tracheal aspirate Klebsiella pneumonia 04/05/2018  Minimal urine output removing 50-100 cc an hour on CRRT  pH 7.37 PCO2 30 PO2 79.  Sodium 137 potassium 4.3 chloride 103 CO2 17 BUN 28 creatinine 1.78 calcium 7.0 phosphorus 2.6 magnesium 2.4 albumin 1.7 hemoglobin 7.1 WBC 11 platelets 58  Chest x-ray no change lines and tubes well positioned persistent effusions left lower lobe atelectasis and infiltrate with mild edema   Objective:  Vital signs in last 24 hours:  Temp:  [96.3 F (35.7 C)-97.9 F (36.6 C)] 96.6 F (35.9 C) (12/25 0600) Pulse Rate:  [63-77] 67 (12/25 0819) Resp:  [12-28] 15 (12/25 0819) BP: (88-119)/(37-70) 98/57 (12/25 0800) SpO2:  [92 %-100 %] 98 % (12/25 0819) Arterial Line BP: (91-114)/(31-42) 91/31 (12/25 0800) FiO2 (%):  [40 %] 40 % (12/25 0819) Weight:  [82.3 kg] 82.3 kg (12/25 0438)  Weight change: 1.9 kg Filed Weights   04/06/18 0500 04/07/18 0441 04/08/18 0438  Weight: 82 kg 80.4 kg 82.3 kg    Intake/Output: I/O last 3 completed shifts: In: 3109.2 [I.V.:1219.7; Blood:345; NG/GT:978.3; IV Piggyback:566.2] Out: 3608 [Urine:134; Emesis/NG output:25; Other:3449]   Intake/Output this shift:  Total I/O In: 65 [I.V.:65] Out: -2   CVS- RRR JVP not elevated no room murmurs rubs or gallops RS- CTA diminished breath sounds at bases ABD- BS present soft non-distended EXT- no edema   Basic Metabolic Panel: Recent Labs  Lab 04/06/18 0414 04/06/18 1613 04/07/18 0310 04/07/18 0312 04/07/18 1231 04/07/18 1600 04/08/18 0447  NA 137 139 138  --   --  138 136  137  K 4.4 5.0  4.8  --   --  5.2* 4.3  4.3  CL 102 105 104  --   --  105 103  103  CO2 24 24 23  --   --  13* 17*  17*  GLUCOSE 135* 172* 138*  --   --  219* 239*  241*  BUN 36* 33* 30*  --   --  30* 28*  28*  CREATININE 2.26* 2.02* 1.80*  --   --  1.99* 1.78*  1.78*  CALCIUM 6.8* 7.0* 7.1*  --   --  7.1* 7.1*  7.0*  MG 2.2  --   --  2.4 2.4 2.4 2.4  PHOS 3.1 3.1 2.7 2.7 3.2 3.0  2.8 2.6  2.6    Liver Function Tests: Recent Labs  Lab 04/08/2018 0145 03/24/2018 0830  04/05/18 0315  04/06/18 0414 04/06/18 1613 04/07/18 0310 04/07/18 1600 04/08/18 0447  AST 258* 232*  --  141*  --  110*  --   --   --   --   ALT 123* 118*  --  86*  --  69*  --   --   --   --   ALKPHOS 129* 124  --  92  --  76  --   --   --   --   BILITOT 1.8* 2.3*  --  2.1*  --  1.5*  --   --   --   --   PROT 4.8* 4.4*  --    4.3*  --  4.4*  --   --   --   --   ALBUMIN 2.2* 2.2*   < > 2.1*  2.0*   < > 1.9* 1.8* 1.8* 1.7* 1.7*   < > = values in this interval not displayed.   No results for input(s): LIPASE, AMYLASE in the last 168 hours. Recent Labs  Lab  0430 04/03/2018 1709  AMMONIA 63* 29    CBC: Recent Labs  Lab 04/05/2018 0145  04/05/18 1430  04/06/18 0414 04/06/18 1700 04/06/18 2141 04/07/18 0312 04/08/18 0447  WBC 27.6*   < > 12.0*  --  5.9  --  10.9* 9.8 11.0*  NEUTROABS 21.5*  --   --   --   --   --   --   --   --   HGB 11.2*   < > 7.2*   < > 7.1* 6.4* 7.4* 7.5* 7.1*  HCT 39.2   < > 22.3*   < > 22.0* 19.6* 22.3* 22.3* 21.6*  MCV 114.0*   < > 100.0  --  98.7  --  97.4 97.0 100.5*  PLT 273   < > 83*  --  68*  --  46* 48* 58*   < > = values in this interval not displayed.    Cardiac Enzymes: Recent Labs  Lab 03/31/2018 1513 04/01/2018 2028 04/05/18 0315 04/05/18 0819 04/06/18 0624  TROPONINI 4.78* 9.41* 10.75* 10.26* 3.79*    BNP: Invalid input(s): POCBNP  CBG: Recent Labs  Lab 04/07/18 1113 04/07/18 1551 04/07/18 1929 04/07/18 2342 04/08/18 0422  GLUCAP 118* 188* 199* 171* 194*     Microbiology: Results for orders placed or performed during the hospital encounter of 03/17/2018  MRSA PCR Screening     Status: None   Collection Time: 04/06/2018 12:59 AM  Result Value Ref Range Status   MRSA by PCR NEGATIVE NEGATIVE Final    Comment:        The GeneXpert MRSA Assay (FDA approved for NASAL specimens only), is one component of a comprehensive MRSA colonization surveillance program. It is not intended to diagnose MRSA infection nor to guide or monitor treatment for MRSA infections. Performed at [facility], [address] [place], [place] [address]   Culture, blood (routine x 2)     Status: None (Preliminary result)   Collection Time: 04/10/2018  1:35 AM  Result Value Ref Range Status   Specimen Description BLOOD LEFT HAND  Final   Special Requests   Final    BOTTLES DRAWN AEROBIC AND ANAEROBIC Blood Culture adequate volume Performed at [facility], [address] [place], [place] [address]    Culture NO GROWTH 4 DAYS  Final   Report Status PENDING  Incomplete  Culture, blood (routine x 2)     Status: None (Preliminary result)   Collection Time: 03/15/2018  2:23 AM  Result Value Ref Range Status   Specimen Description SITE NOT SPECIFIED  Final   Special Requests   Final    BOTTLES DRAWN AEROBIC AND ANAEROBIC Blood Culture adequate volume Performed at [facility], [address] [place], [place] [address]    Culture NO GROWTH 4 DAYS  Final   Report Status PENDING  Incomplete  Culture, respiratory (tracheal aspirate)     Status: None   Collection Time: 04/05/18  7:12 AM  Result Value Ref Range Status   Specimen Description TRACHEAL ASPIRATE  Final   Special Requests NONE    Final   Gram Stain   Final    FEW WBC PRESENT,BOTH PMN AND MONONUCLEAR MODERATE GRAM NEGATIVE RODS FEW GRAM POSITIVE COCCI IN CHAINS RARE GRAM POSITIVE RODS Performed at [facility], [address] [place], [place] [address]    Culture   Final     MODERATE KLEBSIELLA PNEUMONIAE Confirmed Extended Spectrum Beta-Lactamase Producer (ESBL).  In bloodstream infections from ESBL organisms, carbapenems are preferred over piperacillin/tazobactam. They are shown to have a lower risk of mortality.    Report Status 04/07/2018 FINAL  Final   Organism ID, Bacteria KLEBSIELLA PNEUMONIAE  Final      Susceptibility   Klebsiella pneumoniae - MIC*    AMPICILLIN >=32 RESISTANT Resistant     CEFAZOLIN >=64 RESISTANT Resistant     CEFEPIME >=64 RESISTANT Resistant     CEFTAZIDIME >=64 RESISTANT Resistant     CEFTRIAXONE >=64 RESISTANT Resistant     CIPROFLOXACIN >=4 RESISTANT Resistant     GENTAMICIN >=16 RESISTANT Resistant     IMIPENEM <=0.25 SENSITIVE Sensitive     TRIMETH/SULFA >=320 RESISTANT Resistant     AMPICILLIN/SULBACTAM >=32 RESISTANT Resistant     PIP/TAZO >=128 RESISTANT Resistant     Extended ESBL POSITIVE Resistant     * MODERATE KLEBSIELLA PNEUMONIAE    Coagulation Studies: Recent Labs    04/05/18 1648  LABPROT 22.1*  INR 1.96    Urinalysis: No results for input(s): COLORURINE, LABSPEC, PHURINE, GLUCOSEU, HGBUR, BILIRUBINUR, KETONESUR, PROTEINUR, UROBILINOGEN, NITRITE, LEUKOCYTESUR in the last 72 hours.  Invalid input(s): APPERANCEUR    Imaging: Dg Chest Port 1 View  Result Date: 04/08/2018 CLINICAL DATA:  Followup ventilator support EXAM: PORTABLE CHEST 1 VIEW COMPARISON:  04/07/2018 FINDINGS: Endotracheal tube tip is 3 cm above the carina. Nasogastric tube enters the abdomen. Right internal jugular central line tip in the SVC at the azygos level. Bilateral effusions with lower lobe atelectasis and or infiltrate persists. Mild edema pattern persists. IMPRESSION: No change. Lines and tubes well positioned. Persistent effusions with lower lobe atelectasis and/or infiltrate and mild edema. Electronically Signed   By: Mark  Shogry M.D.   On: 04/08/2018 07:03   Dg Chest Port 1 View  Result Date: 04/07/2018 CLINICAL DATA:   Acute respiratory failure. EXAM: PORTABLE CHEST 1 VIEW COMPARISON:  04/06/2018. FINDINGS: Unchanged mild cardiac enlargement. Support tubes and lines are stable. Increasing BILATERAL lower lobe opacities representing either pneumonia or edema. BILATERAL effusions appear increased. No pneumothorax. IMPRESSION: Worsening aeration. Electronically Signed   By: John T Curnes M.D.   On: 04/07/2018 07:20   Vas Us Lower Extremity Venous (dvt)  Result Date: 04/06/2018  Lower Venous Study Performing Technologist: Charlotte Bynum RDMS, RVT  Examination Guidelines: A complete evaluation includes B-mode imaging, spectral Doppler, color Doppler, and power Doppler as needed of all accessible portions of each vessel. Bilateral testing is considered an integral part of a complete examination. Limited examinations for reoccurring indications may be performed as noted.  Right Venous Findings: +---------+---------------+---------+-----------+----------+------------------+          CompressibilityPhasicitySpontaneityPropertiesSummary            +---------+---------------+---------+-----------+----------+------------------+ CFV      Full           Yes      Yes                                     +---------+---------------+---------+-----------+----------+------------------+ SFJ      Full                                                            +---------+---------------+---------+-----------+----------+------------------+   FV Prox  Full                                                            +---------+---------------+---------+-----------+----------+------------------+ FV Mid   Full                                                            +---------+---------------+---------+-----------+----------+------------------+ FV DistalFull                                                            +---------+---------------+---------+-----------+----------+------------------+ PFV      Full                                                             +---------+---------------+---------+-----------+----------+------------------+ POP      Full           Yes      Yes                                     +---------+---------------+---------+-----------+----------+------------------+ PTV      Full                                         poor visualization +---------+---------------+---------+-----------+----------+------------------+ PERO     Full                                         poor visualization +---------+---------------+---------+-----------+----------+------------------+ GSV      Full                                                            +---------+---------------+---------+-----------+----------+------------------+ atherosclerotic changes noted  Left Venous Findings: +---------+---------------+---------+-----------+----------+----------------+          CompressibilityPhasicitySpontaneityPropertiesSummary          +---------+---------------+---------+-----------+----------+----------------+ CFV                                                   Not visualized   +---------+---------------+---------+-----------+----------+----------------+ SFJ                                                     Not visualized   +---------+---------------+---------+-----------+----------+----------------+ FV Prox  Full           Yes      Yes                                   +---------+---------------+---------+-----------+----------+----------------+ FV Mid   Full                                                          +---------+---------------+---------+-----------+----------+----------------+ FV DistalFull                                                          +---------+---------------+---------+-----------+----------+----------------+ PFV                                                   Not visualized    +---------+---------------+---------+-----------+----------+----------------+ POP      Full                                         poor compression +---------+---------------+---------+-----------+----------+----------------+ PTV      Full                                                          +---------+---------------+---------+-----------+----------+----------------+ PERO     Full                                                          +---------+---------------+---------+-----------+----------+----------------+ GSV      Full                                                          +---------+---------------+---------+-----------+----------+----------------+ unable to visualize CFV due to bandages, poor visualization of popliteal vein due to poor patient positioning.    Summary: Right: There is no evidence of deep vein thrombosis in the lower extremity. However, portions of this examination were limited- see technologist comments above. No cystic structure found in the popliteal fossa. Left: There is no evidence of deep vein thrombosis in the lower extremity. However, portions of this examination were limited- see technologist comments above. No cystic structure found in the popliteal fossa.  *See table(s) above for measurements and observations. Electronically signed by Todd Early MD on 04/06/2018 at 1:45:17 PM.    Final      Medications:   .    prismasol BGK 4/2.5 300 mL/hr at 04/07/18 2251  .  prismasol BGK 4/2.5 300 mL/hr at 04/07/18 2326  . sodium chloride Stopped (04/05/18 0625)  . sodium chloride    . feeding supplement (VITAL HIGH PROTEIN) 30 mL/hr at 04/07/18 1604  . fentaNYL infusion INTRAVENOUS 100 mcg/hr (04/08/18 0800)  . heparin 10,000 units/ 20 mL infusion syringe 1,000 Units/hr (04/08/18 0800)  . heparin 999 mL/hr at 04/03/2018 0444  . meropenem (MERREM) IV 1 g (04/07/18 2105)  . pantoprozole (PROTONIX) infusion 8 mg/hr (04/08/18 0810)  . prismasol BGK  2/2.5 dialysis solution 1,500 mL/hr at 04/08/18 0705  . vancomycin Stopped (04/07/18 1808)   . sodium chloride   Intravenous Once  . sodium chloride   Intravenous Once  . aspirin  81 mg Per Tube Daily  . atorvastatin  80 mg Per Tube q1800  . B-complex with vitamin C  1 tablet Oral Daily  . chlorhexidine gluconate (MEDLINE KIT)  15 mL Mouth Rinse BID  . Chlorhexidine Gluconate Cloth  6 each Topical Daily  . donepezil  10 mg Oral QHS  . feeding supplement (PRO-STAT SUGAR FREE 64)  30 mL Per Tube Daily  . hydrocortisone sod succinate (SOLU-CORTEF) inj  50 mg Intravenous Q6H  . insulin aspart  0-15 Units Subcutaneous Q4H  . mouth rinse  15 mL Mouth Rinse 10 times per day  . pantoprazole  40 mg Intravenous Q12H  . sodium chloride flush  10-40 mL Intracatheter Q12H   Place/Maintain arterial line **AND** sodium chloride, albuterol, bisacodyl, docusate, fentaNYL, heparin, heparin, heparin, midazolam, midazolam, sennosides, sodium chloride flush  Assessment/ Plan:   Acute kidney injury presumed no data prior severe hypotension pressors CRRT initiated 03/26/2018.  Hypertension/volume appears to be hypotensive requiring pressors will remove 50 cc an hour with dialysis.  On CRRT  Hyperkalemia metabolic acidosis supportive care appears to have improved  Anemia was using heparin with CRRT will continue to follow  Electrolytes appear to be stable  Prognosis poor currently DNR status  GI bleed appreciate gastroenterology input anticoagulation having been stopped IV Protonix started.  Appreciate help from Dr. Tarri Glenn  History of CVA CT head without contrast 04/03/2018 atrophy with mild small vessel ischemic changes of white matter  History of dementia  Echo 5427 diastolic dysfunction  Septic shock continues on vancomycin and meropenem.  Blood cultures no growth.  Tracheal aspirate 04/05/2018 moderate Klebsiella pneumonia    LOS: 4 Sherril Croon _0 _1 :45 AM

## 2018-04-08 NOTE — Progress Notes (Signed)
NAME:  Craig Mason, MRN:  [id number], DOB:  [birth year], LOS: 4 ADMISSION DATE:  03/16/2018, CHIEF COMPLAINT:  Altered mental status  History of present illness   67 year old man, former smoker, with dementia, CVA, T2DM, history of respiratory failure 2/2 pneumonia with previous trach (decannulated now), GERD presenting with altered mental status. Lives at [facility]. Sister noted that his mental status had changed for the last week - less conversant and interactive.  He had acute worsening of mental status on day of admission with episode of emesis. At baseline he is conversant and oriented x 2 (gets confused about month).  Found to be in multiorgan failure, septic shock..  Intubated at [facility] and transferred to [facility] for further management.    Past Medical History  Former smoker, with dementia, history CVA, T2DM, history of respiratory failure 2/2 pneumonia with previous trach (decannulated now), GERD, osteoporosis.  Significant Hospital Events   12/21 admission, vas cath placement and CRRT initiated. Heparin for elevated troponin 12/22 Developed GI bleed. Heparin held 12/23 Off pressors 12/25 Failing weaning trials  Consults:  Nephrology 12/21 Cardiology 12/22 GI 12/22  Procedures:  Rt IJ vas cath placement 12/21  Rt radial A line 12/20 > 12/21 Rt fem A line 12/21 >  Lt fem CVL 12/20 >  Significant Diagnostic Tests:  CT head w/o contrast on 12/20: No CT evidence for acute intracranial abnormality. Atrophy and mild small vessel ischemic changes of the white matter.  Echocardiogram 12/21-mild LVH, EF 55-60%, no regional wall motion abnormality, grade 1 diastolic dysfunction.  Chest x-ray 12/23-lines and tubes in stable position, bibasal atelectasis. LE duplex 12/23 > no DVT; though CFV not visualized due to bandages and poor visualization of popliteals.  Micro Data:  BCx 12/21 No growth Resp Cx 12/21 Kleb (ESBL)  Antimicrobials:  Vanc 12/21 > Cefepime  12/21 > 12/24 Meropenem 12/24 >  Interim history/subjective:  Off pressors, blood pressure soft today morning Doing well on pressure support weans but mental status still poor.  Objective   Blood pressure (!) 98/57, pulse 67, temperature 97.7 F (36.5 C), temperature source Core, resp. rate 15, height 5\' 8"  (1.727 m), weight 82.3 kg, SpO2 98 %.    Vent Mode: CPAP;PSV FiO2 (%):  [40 %] 40 % Set Rate:  [18 bmp] 18 bmp Vt Set:  [430 mL] 430 mL PEEP:  [5 cmH20] 5 cmH20 Pressure Support:  [8 cmH20] 8 cmH20 Plateau Pressure:  [14 cmH20-15 cmH20] 15 cmH20   Intake/Output Summary (Last 24 hours) at 04/08/2018 1004 Last data filed at 04/08/2018 0900 Gross per 24 hour  Intake 2185.63 ml  Output 2541 ml  Net -355.37 ml   Filed Weights   04/06/18 0500 04/07/18 0441 04/08/18 0438  Weight: 82 kg 80.4 kg 82.3 kg   Examination:  Gen:      No acute distress HEENT:  EOMI, sclera anicteric Neck:     No masses; no thyromegaly, ET tube Lungs:    Clear to auscultation bilaterally; normal respiratory effort CV:         Regular rate and rhythm; no murmurs Abd:      + bowel sounds; soft, non-tender; no palpable masses, no distension Ext:    No edema; adequate peripheral perfusion Skin:      Warm and dry; no rash Neuro: Sedated, unresponsive  Assessment & Plan:  Multiorgan failure Septic shock with lactic acidosis.  Now off all pressors Reduce dose steroids.  Hydrocortisone 50 mg IV every 12. Follow  cultures, procalcitonin  Respiratory failure Klebsiella pneumonia infection Continue pressure support weans Wean off sedation to allow mental status to improve. Continue meropenem Stop vancomycin as MRSA swab is negative.  Elevated troponins, NSTEMI Appreciate consult from cardiology Continue supportive care for now Not a candidate for heart cath due to GI bleed and renal failure  GI bleed Follow CBC.  Transfuse for hemoglobin less than 7 Finish PPI drip Transition to Protonix twice  daily GI signed off.  Thrombocytopenia Follow platelet counts Monitor for signs of bleeding  Acute kidney injury Hyperkalemia and metabolic acidosis with increased anion gap > resolved  Continue CRRT  Diabetes, hyperglycemia SSI coverage  Elevated LFTs, ammonia Likely from shock liver.  Follow labs. Right upper quadrant ultrasound is unremarkable  Best practice:  Diet: Tube feeds Pain/Anxiety/Delirium protocol (if indicated): Fentanyl gtt, versed prn. RASS goal 0 VAP protocol (if indicated): ordered DVT prophylaxis: SCDs GI prophylaxis: PPI bid Glucose control: SSI Mobility: Bed rest Code Status: DNR confirmed with sister.  Family Communication: Sister is healthcare POA. Updated her and brother in law at bedside.  Disposition: MICU  The patient is critically ill with multiple organ system failure and requires high complexity decision making for assessment and support, frequent evaluation and titration of therapies, advanced monitoring, review of radiographic studies and interpretation of complex data.   Critical Care Time devoted to patient care services, exclusive of separately billable procedures, described in this note is 35 minutes.   Chilton GreathousePraveen Jahara Dail MD [place] [facility] Pager [phone] If no answer or after 3pm call: [phone] 04/08/2018, 10:13 AM

## 2018-04-09 ENCOUNTER — Inpatient Hospital Stay (HOSPITAL_COMMUNITY): Payer: Medicare (Managed Care)

## 2018-04-09 LAB — CBC
HCT: 20.7 % — ABNORMAL LOW (ref 39.0–52.0)
Hemoglobin: 7 g/dL — ABNORMAL LOW (ref 13.0–17.0)
MCH: 33 pg (ref 26.0–34.0)
MCHC: 33.8 g/dL (ref 30.0–36.0)
MCV: 97.6 fL (ref 80.0–100.0)
Platelets: 71 10*3/uL — ABNORMAL LOW (ref 150–400)
RBC: 2.12 MIL/uL — AB (ref 4.22–5.81)
RDW: 14.8 % (ref 11.5–15.5)
WBC: 11.7 10*3/uL — ABNORMAL HIGH (ref 4.0–10.5)
nRBC: 0.6 % — ABNORMAL HIGH (ref 0.0–0.2)

## 2018-04-09 LAB — RENAL FUNCTION PANEL
Albumin: 1.6 g/dL — ABNORMAL LOW (ref 3.5–5.0)
Albumin: 1.4 g/dL — ABNORMAL LOW (ref 3.5–5.0)
Anion gap: 8 (ref 5–15)
Anion gap: 4 — ABNORMAL LOW (ref 5–15)
BUN: 28 mg/dL — ABNORMAL HIGH (ref 8–23)
BUN: 42 mg/dL — ABNORMAL HIGH (ref 8–23)
CALCIUM: 6.9 mg/dL — AB (ref 8.9–10.3)
CO2: 26 mmol/L (ref 22–32)
CO2: 28 mmol/L (ref 22–32)
CREATININE: 1.29 mg/dL — AB (ref 0.61–1.24)
Calcium: 7.1 mg/dL — ABNORMAL LOW (ref 8.9–10.3)
Chloride: 103 mmol/L (ref 98–111)
Chloride: 106 mmol/L (ref 98–111)
Creatinine, Ser: 1.6 mg/dL — ABNORMAL HIGH (ref 0.61–1.24)
GFR calc Af Amer: 51 mL/min — ABNORMAL LOW (ref >60)
GFR calc non Af Amer: 57 mL/min — ABNORMAL LOW (ref >60)
GFR calc non Af Amer: 44 mL/min — ABNORMAL LOW (ref >60)
Glucose, Bld: 239 mg/dL — ABNORMAL HIGH (ref 70–99)
Glucose, Bld: 308 mg/dL — ABNORMAL HIGH (ref 70–99)
PHOSPHORUS: 1.3 mg/dL — AB (ref 2.5–4.6)
Phosphorus: 1.1 mg/dL — ABNORMAL LOW (ref 2.5–4.6)
Potassium: 3.2 mmol/L — ABNORMAL LOW (ref 3.5–5.1)
Potassium: 4.3 mmol/L (ref 3.5–5.1)
SODIUM: 138 mmol/L (ref 135–145)
Sodium: 137 mmol/L (ref 135–145)

## 2018-04-09 LAB — POCT I-STAT 3, ART BLOOD GAS (G3+)
Acid-Base Excess: 2 mmol/L (ref 0.0–2.0)
Bicarbonate: 25.2 mmol/L (ref 20.0–28.0)
O2 Saturation: 95 %
TCO2: 26 mmol/L (ref 22–32)
pCO2 arterial: 33.3 mmHg (ref 32.0–48.0)
pH, Arterial: 7.486 — ABNORMAL HIGH (ref 7.350–7.450)
pO2, Arterial: 67 mmHg — ABNORMAL LOW (ref 83.0–108.0)

## 2018-04-09 LAB — GLUCOSE, CAPILLARY
GLUCOSE-CAPILLARY: 276 mg/dL — AB (ref 70–99)
Glucose-Capillary: 138 mg/dL — ABNORMAL HIGH (ref 70–99)
Glucose-Capillary: 225 mg/dL — ABNORMAL HIGH (ref 70–99)
Glucose-Capillary: 228 mg/dL — ABNORMAL HIGH (ref 70–99)
Glucose-Capillary: 279 mg/dL — ABNORMAL HIGH (ref 70–99)
Glucose-Capillary: 289 mg/dL — ABNORMAL HIGH (ref 70–99)
Glucose-Capillary: 319 mg/dL — ABNORMAL HIGH (ref 70–99)

## 2018-04-09 LAB — CULTURE, BLOOD (ROUTINE X 2)
Culture: NO GROWTH
Culture: NO GROWTH
Special Requests: ADEQUATE
Special Requests: ADEQUATE

## 2018-04-09 LAB — MAGNESIUM: Magnesium: 2.4 mg/dL (ref 1.7–2.4)

## 2018-04-09 LAB — PHOSPHORUS: Phosphorus: 1.2 mg/dL — ABNORMAL LOW (ref 2.5–4.6)

## 2018-04-09 LAB — POCT ACTIVATED CLOTTING TIME
Activated Clotting Time: 191 seconds
Activated Clotting Time: 202 seconds
Activated Clotting Time: 202 seconds

## 2018-04-09 MED ORDER — SODIUM PHOSPHATES 45 MMOLE/15ML IV SOLN
20.0000 mmol | Freq: Once | INTRAVENOUS | Status: AC
  Administered 2018-04-09: 20 mmol via INTRAVENOUS
  Filled 2018-04-09 (×2): qty 6.67

## 2018-04-09 MED ORDER — POTASSIUM CHLORIDE 20 MEQ/15ML (10%) PO SOLN
40.0000 meq | ORAL | Status: AC
  Administered 2018-04-09 (×2): 40 meq via ORAL
  Filled 2018-04-09 (×2): qty 30

## 2018-04-09 MED ORDER — ACETAMINOPHEN 160 MG/5ML PO SOLN
650.0000 mg | Freq: Four times a day (QID) | ORAL | Status: DC | PRN
  Administered 2018-04-09 – 2018-04-12 (×5): 650 mg
  Filled 2018-04-09 (×5): qty 20.3

## 2018-04-09 MED ORDER — INSULIN DETEMIR 100 UNIT/ML ~~LOC~~ SOLN
15.0000 [IU] | Freq: Two times a day (BID) | SUBCUTANEOUS | Status: DC
  Administered 2018-04-09 – 2018-04-10 (×2): 15 [IU] via SUBCUTANEOUS
  Filled 2018-04-09 (×3): qty 0.15

## 2018-04-09 NOTE — Progress Notes (Addendum)
NAME:  Craig Mason, MRN:  [id number], DOB:  Jan 09, 1951, LOS: 5 ADMISSION DATE:  12-31-17, CHIEF COMPLAINT:  Altered mental status  History of present illness   67 year old man, former smoker, with dementia, CVA, T2DM, history of respiratory failure 2/2 pneumonia with previous trach (decannulated now), GERD presenting with altered mental status. Lives at [facility]. Sister noted that his mental status had changed for the last week - less conversant and interactive.  He had acute worsening of mental status on day of admission with episode of emesis. At baseline he is conversant and oriented x 2 (gets confused about month).  Found to be in multiorgan failure, septic shock..  Intubated at [facility] and transferred [facility] for further management.    Past Medical History  Former smoker, with dementia, history CVA, T2DM, history of respiratory failure 2/2 pneumonia with previous trach (decannulated now), GERD, osteoporosis.  Significant Hospital Events   12/21 Admission, vas cath placement and CRRT initiated. Heparin for elevated troponin 12/22 Developed GI bleed. Heparin stopped 12/23 Off pressors 12/25 Failing weaning trials 12/26 CVVH stopped  Consults:  Nephrology 12/21 Cardiology 12/22. Signed off GI 12/22. Signed off  Procedures:  Rt IJ vas cath placement 12/21  Rt radial A line 12/20 > 12/21 Rt fem A line 12/21 > 12/26 Lt fem CVL 12/20 >   Significant Diagnostic Tests:  CT head w/o contrast on 12/20: No CT evidence for acute intracranial abnormality. Atrophy and mild small vessel ischemic changes of the white matter.  Echocardiogram 12/21-mild LVH, EF 55-60%, no regional wall motion abnormality, grade 1 diastolic dysfunction.  LE duplex 12/23 > no DVT; though CFV not visualized due to bandages and poor visualization of popliteals.  Chest x-ray 12/26> ET tube in good position, bibasal atelectasis.  I have reviewed the images personally.  Micro Data:  BCx  12/21 No growth Resp Cx 12/21 Kleb (ESBL)  Antimicrobials:  Vanc 12/21 > 12/25 Cefepime 12/21 > 12/24 Meropenem 12/24 >  Interim history/subjective:  Continues off pressors Weaning on pressure support Not yet awake.  Objective   Blood pressure (!) 109/54, pulse 66, temperature (!) 97.2 F (36.2 C), temperature source Bladder, resp. rate 16, height 5\' 8"  (1.727 m), weight 83 kg, SpO2 100 %.    Vent Mode: PRVC FiO2 (%):  [40 %] 40 % Set Rate:  [18 bmp] 18 bmp Vt Set:  [430 mL] 430 mL PEEP:  [5 cmH20] 5 cmH20 Pressure Support:  [8 cmH20] 8 cmH20 Plateau Pressure:  [12 cmH20-14 cmH20] 12 cmH20   Intake/Output Summary (Last 24 hours) at 04/09/2018 0928 Last data filed at 04/09/2018 [id number] Gross per 24 hour  Intake 1814.81 ml  Output 2309 ml  Net -494.19 ml   Filed Weights   04/07/18 0441 04/08/18 0438 04/09/18 0500  Weight: 80.4 kg 82.3 kg 83 kg   Examination:  Gen:      No acute distress HEENT:  EOMI, sclera anicteric Neck:     No masses; no thyromegaly, ET tube Lungs:    Clear to auscultation bilaterally; normal respiratory effort CV:         Regular rate and rhythm; no murmurs Abd:      + bowel sounds; soft, non-tender; no palpable masses, no distension Ext:    No edema; adequate peripheral perfusion Skin:      Warm and dry; no rash Neuro: Sedated, unresponsive  Assessment & Plan:  Multiorgan failure Septic shock with lactic acidosis.  Now off all pressors Stop  stress dose steroids DC A-line We will remove left IJ central line once we get adequate peripheral access.  Respiratory failure ESBL Klebsiella pneumonia infection Continue pressure support weans Wean off sedation to allow mental status to improve. Continue meropenem  Elevated troponins, NSTEMI Continue supportive care for now Not a candidate for heart cath due to GI bleed and renal failure Continue ASA, statin Cardiology signed off  GI bleed Follow CBC.  Transfuse for hemoglobin less than  7 Finish PPI drip PPI bid GI signed off.  Thrombocytopenia Follow platelet counts Monitor for signs of bleeding  Acute kidney injury Hyperkalemia and metabolic acidosis with increased anion gap > resolved  Off CRRT today. Follow urine output  Diabetes, hyperglycemia SSI coverage Levemir 15 units bid  CVA, baseline dementia On Arcept  Best practice:  Diet: Tube feeds Pain/Anxiety/Delirium protocol (if indicated): Fentanyl gtt, versed prn. RASS goal 0 VAP protocol (if indicated): ordered DVT prophylaxis: SCDs GI prophylaxis: PPI bid Glucose control: SSI Mobility: Bed rest Code Status: DNR confirmed with sister.  Family Communication: Sister is healthcare POA. Updated her and brother in law at bedside.  Disposition: MICU  The patient is critically ill with multiple organ system failure and requires high complexity decision making for assessment and support, frequent evaluation and titration of therapies, advanced monitoring, review of radiographic studies and interpretation of complex data.   Critical Care Time devoted to patient care services, exclusive of separately billable procedures, described in this note is 35 minutes.   Chilton GreathousePraveen Franciso Dierks MD  Pulmonary and Critical Care Pager [phone] If no answer or after 3pm call: [phone] 04/09/2018, 9:45 AM

## 2018-04-09 NOTE — Progress Notes (Addendum)
1900  Large black loose stool noted.  Bath and linen change given.  2100  Medium black loose stool noted.  Bath and linen change given.  Noted OGT out significant distance.  Stopped tube feed,  Suctioned via ballard for minimal secretions.  Re-inserted OGT to appropriate depth, taped it, ausciltated air in stomach, called for stat xray.  Once xray revealed tube in correct place in stomach, black box notified and tube feed restarted.  0400  Large black loose stool noted.  Call MD Dederding for rectal tube.  Black stool appears old from upper GI from previous GI bleed.  Is not fresh frank blood at this time.  Patient bathed, new linen and gown applied.  Rectal tube inserted without incident.

## 2018-04-09 NOTE — Progress Notes (Signed)
[facility] ROUNDING NOTE   Subjective:   Patient continues on CRRT.  This appears to have been initiated 03/30/2018.  No change.  Appears to be doing well today with some increase in urine output.  Blood pressure 109/54 pulse 66 temperature 97.2 O2 sats 100% FiO2 40%   IV meropenem  Tracheal aspirate Klebsiella pneumonia 04/05/2018  Minimal urine output removing 50-100 cc an hour on CRRT  Sodium 137 potassium 3.2 chloride 103 CO2 26 BUN 28 creatinine 1.29 glucose 239 phosphorus 1.2 magnesium 2.4 Albumin 1.6  Chest x-ray no change lines and tubes well positioned persistent effusions left lower lobe atelectasis and infiltrate with mild edema   Objective:  Vital signs in last 24 hours:  Temp:  [96.4 F (35.8 C)-97.7 F (36.5 C)] 97.2 F (36.2 C) (12/26 0800) Pulse Rate:  [66-81] 66 (12/26 0900) Resp:  [12-28] 16 (12/26 0900) BP: (97-147)/(43-74) 109/54 (12/26 0900) SpO2:  [98 %-100 %] 100 % (12/26 0900) Arterial Line BP: (83-130)/(34-100) 109/45 (12/26 0900) FiO2 (%):  [40 %] 40 % (12/26 0315) Weight:  [83 kg] 83 kg (12/26 0500)  Weight change: 0.7 kg Filed Weights   04/07/18 0441 04/08/18 0438 04/09/18 0500  Weight: 80.4 kg 82.3 kg 83 kg    Intake/Output: I/O last 3 completed shifts: In: 2798.1 [I.V.:785.2; NG/GT:1657.3; IV Piggyback:355.6] Out: 3246 [Urine:259; IOEVO:3500; Stool:300]   Intake/Output this shift:  Total I/O In: 150 [I.V.:20; NG/GT:130] Out: 188 [Urine:22; Other:166]  CVS- RRR JVP not elevated no room murmurs rubs or gallops RS- CTA diminished breath sounds at bases ABD- BS present soft non-distended EXT- no edema   Basic Metabolic Panel: Recent Labs  Lab 04/07/18 0310  04/07/18 1231 04/07/18 1600 04/08/18 0447 04/08/18 1629 04/09/18 0520  NA 138  --   --  138 136  137 136 137  K 4.8  --   --  5.2* 4.3  4.3 3.9 3.2*  CL 104  --   --  105 103  103 105 103  CO2 23  --   --  13* 17*  17* 20* 26  GLUCOSE 138*  --   --   219* 239*  241* 306* 239*  BUN 30*  --   --  30* 28*  28* 31* 28*  CREATININE 1.80*  --   --  1.99* 1.78*  1.78* 1.52* 1.29*  CALCIUM 7.1*  --   --  7.1* 7.1*  7.0* 6.8* 7.1*  MG  --    < > 2.4 2.4 2.4 2.3 2.4  PHOS 2.7   < > 3.2 3.0  2.8 2.6  2.6 1.8*  1.7* 1.2*  1.1*   < > = values in this interval not displayed.    Liver Function Tests: Recent Labs  Lab 04/09/2018 0145 04/11/2018 0830  04/05/18 0315  04/06/18 0414  04/07/18 0310 04/07/18 1600 04/08/18 0447 04/08/18 1629 04/09/18 0520  AST 258* 232*  --  141*  --  110*  --   --   --   --   --   --   ALT 123* 118*  --  86*  --  69*  --   --   --   --   --   --   ALKPHOS 129* 124  --  92  --  76  --   --   --   --   --   --   BILITOT 1.8* 2.3*  --  2.1*  --  1.5*  --   --   --   --   --   --  PROT 4.8* 4.4*  --  4.3*  --  4.4*  --   --   --   --   --   --   ALBUMIN 2.2* 2.2*   < > 2.1*  2.0*   < > 1.9*   < > 1.8* 1.7* 1.7* 1.5* 1.6*   < > = values in this interval not displayed.   No results for input(s): LIPASE, AMYLASE in the last 168 hours. Recent Labs  Lab 03/23/2018 0430 03/30/2018 1709  AMMONIA 63* 29    CBC: Recent Labs  Lab 03/31/2018 0145  04/06/18 0414 04/06/18 1700 04/06/18 2141 04/07/18 0312 04/08/18 0447 04/09/18 0520  WBC 27.6*   < > 5.9  --  10.9* 9.8 11.0* 11.7*  NEUTROABS 21.5*  --   --   --   --   --   --   --   HGB 11.2*   < > 7.1* 6.4* 7.4* 7.5* 7.1* 7.0*  HCT 39.2   < > 22.0* 19.6* 22.3* 22.3* 21.6* 20.7*  MCV 114.0*   < > 98.7  --  97.4 97.0 100.5* 97.6  PLT 273   < > 68*  --  46* 48* 58* 71*   < > = values in this interval not displayed.    Cardiac Enzymes: Recent Labs  Lab 03/15/2018 1513 03/25/2018 2028 04/05/18 0315 04/05/18 0819 04/06/18 0624  TROPONINI 4.78* 9.41* 10.75* 10.26* 3.79*    BNP: Invalid input(s): POCBNP  CBG: Recent Labs  Lab 04/08/18 1630 04/08/18 1958 04/09/18 0010 04/09/18 0402 04/09/18 0804  GLUCAP 290* 258* 138* 225* 66*     Microbiology: Results for orders placed or performed during the hospital encounter of 04/10/2018  MRSA PCR Screening     Status: None   Collection Time: 04/02/2018 12:59 AM  Result Value Ref Range Status   MRSA by PCR NEGATIVE NEGATIVE Final    Comment:        The GeneXpert MRSA Assay (FDA approved for NASAL specimens only), is one component of a comprehensive MRSA colonization surveillance program. It is not intended to diagnose MRSA infection nor to guide or monitor treatment for MRSA infections. Performed at [facility], [place] [address] [place] [facility] [place], [place] [address]   Culture, blood (routine x 2)     Status: None (Preliminary result)   Collection Time: 04/07/2018  1:35 AM  Result Value Ref Range Status   Specimen Description BLOOD LEFT HAND  Final   Special Requests   Final    BOTTLES DRAWN AEROBIC AND ANAEROBIC Blood Culture adequate volume Performed at [facility], [place] [address] [place], [place] [address]    Culture NO GROWTH 4 DAYS  Final   Report Status PENDING  Incomplete  Culture, blood (routine x 2)     Status: None (Preliminary result)   Collection Time: 04/11/2018  2:23 AM  Result Value Ref Range Status   Specimen Description SITE NOT SPECIFIED  Final   Special Requests   Final    BOTTLES DRAWN AEROBIC AND ANAEROBIC Blood Culture adequate volume Performed at [facility], [place] [address], [place], [place] [address]    Culture NO GROWTH 4 DAYS  Final   Report Status PENDING  Incomplete  Culture, respiratory (tracheal aspirate)     Status: None   Collection Time: 04/05/18  7:12 AM  Result Value Ref Range Status   Specimen Description TRACHEAL ASPIRATE  Final   Special Requests NONE  Final   Gram  Stain   Final    FEW WBC PRESENT,BOTH PMN AND MONONUCLEAR MODERATE GRAM NEGATIVE RODS FEW GRAM POSITIVE COCCI IN CHAINS RARE GRAM POSITIVE RODS Performed at [facility], [place] [address] [place],  [address]    Culture   Final     MODERATE KLEBSIELLA PNEUMONIAE Confirmed Extended Spectrum Beta-Lactamase Producer (ESBL).  In bloodstream infections from ESBL organisms, carbapenems are preferred over piperacillin/tazobactam. They are shown to have a lower risk of mortality.    Report Status 04/07/2018 FINAL  Final   Organism ID, Bacteria KLEBSIELLA PNEUMONIAE  Final      Susceptibility   Klebsiella pneumoniae - MIC*    AMPICILLIN >=32 RESISTANT Resistant     CEFAZOLIN >=64 RESISTANT Resistant     CEFEPIME >=64 RESISTANT Resistant     CEFTAZIDIME >=64 RESISTANT Resistant     CEFTRIAXONE >=64 RESISTANT Resistant     CIPROFLOXACIN >=4 RESISTANT Resistant     GENTAMICIN >=16 RESISTANT Resistant     IMIPENEM <=0.25 SENSITIVE Sensitive     TRIMETH/SULFA >=320 RESISTANT Resistant     AMPICILLIN/SULBACTAM >=32 RESISTANT Resistant     PIP/TAZO >=128 RESISTANT Resistant     Extended ESBL POSITIVE Resistant     * MODERATE KLEBSIELLA PNEUMONIAE    Coagulation Studies: No results for input(s): LABPROT, INR in the last 72 hours.  Urinalysis: No results for input(s): COLORURINE, LABSPEC, PHURINE, GLUCOSEU, HGBUR, BILIRUBINUR, KETONESUR, PROTEINUR, UROBILINOGEN, NITRITE, LEUKOCYTESUR in the last 72 hours.  Invalid input(s): APPERANCEUR    Imaging: Dg Chest Port 1 View  Result Date: 04/09/2018 CLINICAL DATA:  Check gastric catheter placement EXAM: PORTABLE CHEST 1 VIEW COMPARISON:  Film from earlier in the same day. FINDINGS: Cardiac shadow is stable. Aortic calcifications are again seen. Right jugular central line and endotracheal tube are again seen and stable. Gastric catheter extends into the stomach. The tip is not evaluated on this image. Persistent bibasilar atelectasis is seen. No pneumothorax is noted. IMPRESSION: Tubes and lines as described above. Bibasilar atelectasis is noted. Electronically Signed   By: Inez Catalina M.D.   On: 04/09/2018 01:10   Dg Chest Port 1 View  Result Date: 04/08/2018 CLINICAL DATA:   Followup ventilator support EXAM: PORTABLE CHEST 1 VIEW COMPARISON:  04/07/2018 FINDINGS: Endotracheal tube tip is 3 cm above the carina. Nasogastric tube enters the abdomen. Right internal jugular central line tip in the SVC at the azygos level. Bilateral effusions with lower lobe atelectasis and or infiltrate persists. Mild edema pattern persists. IMPRESSION: No change. Lines and tubes well positioned. Persistent effusions with lower lobe atelectasis and/or infiltrate and mild edema. Electronically Signed   By: Nelson Chimes M.D.   On: 04/08/2018 07:03   Dg Abd Portable 1v  Result Date: 04/09/2018 CLINICAL DATA:  Check gastric catheter placement EXAM: PORTABLE ABDOMEN - 1 VIEW COMPARISON:  None. FINDINGS: Scattered large and small bowel gas is noted. Nasogastric catheter is noted within the stomach. No free air is seen. Left femoral line is noted. Degenerative changes of lumbar spine are seen. Compression deformities are noted at T12 and L1. IMPRESSION: Chronic changes without acute abnormality. Gastric catheter within the stomach. Electronically Signed   By: Inez Catalina M.D.   On: 04/09/2018 01:11     Medications:   .  prismasol BGK 4/2.5 300 mL/hr at 04/08/18 1553  .  prismasol BGK 4/2.5 300 mL/hr at 04/08/18 1600  . sodium chloride Stopped (04/05/18 0625)  . sodium chloride    . feeding supplement (VITAL  HIGH PROTEIN) 65 mL/hr at 04/09/18 0900  . fentaNYL infusion INTRAVENOUS 100 mcg/hr (04/09/18 0900)  . heparin 10,000 units/ 20 mL infusion syringe 1,000 Units/hr (04/09/18 0900)  . heparin 999 mL/hr at 04/02/2018 0444  . meropenem (MERREM) IV Stopped (04/08/18 2307)  . prismasol BGK 2/2.5 dialysis solution 1,500 mL/hr at 04/09/18 0734   . sodium chloride   Intravenous Once  . aspirin  81 mg Per Tube Daily  . atorvastatin  80 mg Per Tube q1800  . B-complex with vitamin C  1 tablet Oral Daily  . chlorhexidine gluconate (MEDLINE KIT)  15 mL Mouth Rinse BID  . Chlorhexidine Gluconate Cloth   6 each Topical Daily  . donepezil  10 mg Oral QHS  . feeding supplement (PRO-STAT SUGAR FREE 64)  30 mL Per Tube Daily  . hydrocortisone sod succinate (SOLU-CORTEF) inj  50 mg Intravenous Q12H  . insulin aspart  0-15 Units Subcutaneous Q4H  . insulin glargine  20 Units Subcutaneous Daily  . mouth rinse  15 mL Mouth Rinse 10 times per day  . pantoprazole  40 mg Intravenous Q12H  . sodium chloride flush  10-40 mL Intracatheter Q12H   Place/Maintain arterial line **AND** sodium chloride, albuterol, bisacodyl, fentaNYL, heparin, heparin, heparin, midazolam, midazolam, sodium chloride flush  Assessment/ Plan:   Acute kidney injury presumed no data prior severe hypotension pressors CRRT initiated 03/30/2018.  We will stop CRRT at this time 04/09/2018 and see if he can recover his renal function  Hypertension/volume appears to be doing well now off pressors.  Will discontinue CRRT today to see if there is improvement in renal function  Hyperkalemia metabolic acidosis supportive care appears to have improved  Hypokalemia replete per CCM  Anemia was using heparin with CRRT will continue to follow  Electrolytes appear to be stable   Prognosis poor currently DNR status  GI bleed appreciate gastroenterology input anticoagulation having been stopped IV Protonix started.  Appreciate help from Dr. Tarri Glenn  History of CVA CT head without contrast 04/03/2018 atrophy with mild small vessel ischemic changes of white matter  History of dementia  Echo 6160 diastolic dysfunction  Septic shock continues on vancomycin and meropenem.  Blood cultures no growth.  Tracheal aspirate 04/05/2018 moderate Klebsiella pneumonia  Hypophosphatemia will replete with IV sodium phosphate    LOS: 5 Sherril Croon _0 _1 :13 AM

## 2018-04-09 NOTE — Progress Notes (Signed)
Stopped CRRT after 152 cc of blood returned - no noted clots in system - pt tolerated well than assess for dialysis instilled into 2 ports with 1200 u Heparin and capped off

## 2018-04-10 ENCOUNTER — Inpatient Hospital Stay (HOSPITAL_COMMUNITY): Payer: Medicare (Managed Care)

## 2018-04-10 DIAGNOSIS — I6310 Cerebral infarction due to embolism of unspecified precerebral artery: Secondary | ICD-10-CM

## 2018-04-10 DIAGNOSIS — G9340 Encephalopathy, unspecified: Secondary | ICD-10-CM

## 2018-04-10 DIAGNOSIS — I6340 Cerebral infarction due to embolism of unspecified cerebral artery: Secondary | ICD-10-CM

## 2018-04-10 LAB — RENAL FUNCTION PANEL
Albumin: 1.7 g/dL — ABNORMAL LOW (ref 3.5–5.0)
Anion gap: 6 (ref 5–15)
BUN: 28 mg/dL — ABNORMAL HIGH (ref 8–23)
CO2: 31 mmol/L (ref 22–32)
CREATININE: 1.17 mg/dL (ref 0.61–1.24)
Calcium: 7.8 mg/dL — ABNORMAL LOW (ref 8.9–10.3)
Chloride: 106 mmol/L (ref 98–111)
GFR calc Af Amer: >60 mL/min (ref >60)
GFR calc non Af Amer: >60 mL/min (ref >60)
GLUCOSE: 147 mg/dL — AB (ref 70–99)
Phosphorus: 1.4 mg/dL — ABNORMAL LOW (ref 2.5–4.6)
Potassium: 3.3 mmol/L — ABNORMAL LOW (ref 3.5–5.1)
Sodium: 143 mmol/L (ref 135–145)

## 2018-04-10 LAB — COMPREHENSIVE METABOLIC PANEL
ALT: 43 U/L (ref 0–44)
AST: 44 U/L — ABNORMAL HIGH (ref 15–41)
Albumin: 1.4 g/dL — ABNORMAL LOW (ref 3.5–5.0)
Alkaline Phosphatase: 81 U/L (ref 38–126)
Anion gap: 8 (ref 5–15)
BILIRUBIN TOTAL: 0.7 mg/dL (ref 0.3–1.2)
BUN: 61 mg/dL — ABNORMAL HIGH (ref 8–23)
CO2: 26 mmol/L (ref 22–32)
Calcium: 7 mg/dL — ABNORMAL LOW (ref 8.9–10.3)
Chloride: 105 mmol/L (ref 98–111)
Creatinine, Ser: 2.18 mg/dL — ABNORMAL HIGH (ref 0.61–1.24)
GFR calc Af Amer: 35 mL/min — ABNORMAL LOW (ref >60)
GFR calc non Af Amer: 30 mL/min — ABNORMAL LOW (ref >60)
Glucose, Bld: 271 mg/dL — ABNORMAL HIGH (ref 70–99)
Potassium: 3.7 mmol/L (ref 3.5–5.1)
Sodium: 139 mmol/L (ref 135–145)
TOTAL PROTEIN: 4.3 g/dL — AB (ref 6.5–8.1)

## 2018-04-10 LAB — CBC WITH DIFFERENTIAL/PLATELET
Band Neutrophils: 0 %
Basophils Absolute: 0 10*3/uL (ref 0.0–0.1)
Basophils Relative: 0 %
Blasts: 0 %
Eosinophils Absolute: 0.2 10*3/uL (ref 0.0–0.5)
Eosinophils Relative: 1 %
HCT: 30.5 % — ABNORMAL LOW (ref 39.0–52.0)
Hemoglobin: 10.2 g/dL — ABNORMAL LOW (ref 13.0–17.0)
Lymphocytes Relative: 4 %
Lymphs Abs: 0.8 10*3/uL (ref 0.7–4.0)
MCH: 30.7 pg (ref 26.0–34.0)
MCHC: 33.4 g/dL (ref 30.0–36.0)
MCV: 91.9 fL (ref 80.0–100.0)
Metamyelocytes Relative: 0 %
Monocytes Absolute: 1.8 10*3/uL — ABNORMAL HIGH (ref 0.1–1.0)
Monocytes Relative: 9 %
Myelocytes: 3 %
Neutro Abs: 16.7 10*3/uL — ABNORMAL HIGH (ref 1.7–7.7)
Neutrophils Relative %: 83 %
Other: 0 %
Platelets: 97 10*3/uL — ABNORMAL LOW (ref 150–400)
Promyelocytes Relative: 0 %
RBC: 3.32 MIL/uL — ABNORMAL LOW (ref 4.22–5.81)
RDW: 15.9 % — ABNORMAL HIGH (ref 11.5–15.5)
WBC: 19.5 10*3/uL — ABNORMAL HIGH (ref 4.0–10.5)
nRBC: 0 /100 WBC
nRBC: 1.2 % — ABNORMAL HIGH (ref 0.0–0.2)

## 2018-04-10 LAB — GLUCOSE, CAPILLARY
GLUCOSE-CAPILLARY: 185 mg/dL — AB (ref 70–99)
Glucose-Capillary: 256 mg/dL — ABNORMAL HIGH (ref 70–99)
Glucose-Capillary: 237 mg/dL — ABNORMAL HIGH (ref 70–99)
Glucose-Capillary: 202 mg/dL — ABNORMAL HIGH (ref 70–99)
Glucose-Capillary: 208 mg/dL — ABNORMAL HIGH (ref 70–99)
Glucose-Capillary: 144 mg/dL — ABNORMAL HIGH (ref 70–99)

## 2018-04-10 LAB — CBC
HCT: 19.8 % — ABNORMAL LOW (ref 39.0–52.0)
HEMOGLOBIN: 6.5 g/dL — AB (ref 13.0–17.0)
MCH: 32 pg (ref 26.0–34.0)
MCHC: 32.8 g/dL (ref 30.0–36.0)
MCV: 97.5 fL (ref 80.0–100.0)
Platelets: 91 10*3/uL — ABNORMAL LOW (ref 150–400)
RBC: 2.03 MIL/uL — ABNORMAL LOW (ref 4.22–5.81)
RDW: 14.8 % (ref 11.5–15.5)
WBC: 13.9 10*3/uL — ABNORMAL HIGH (ref 4.0–10.5)
nRBC: 1.6 % — ABNORMAL HIGH (ref 0.0–0.2)

## 2018-04-10 LAB — PREPARE RBC (CROSSMATCH)

## 2018-04-10 LAB — MAGNESIUM: Magnesium: 2.1 mg/dL (ref 1.7–2.4)

## 2018-04-10 LAB — PHOSPHORUS: Phosphorus: 1.4 mg/dL — ABNORMAL LOW (ref 2.5–4.6)

## 2018-04-10 MED ORDER — HEPARIN SODIUM (PORCINE) 1000 UNIT/ML IJ SOLN
2400.0000 [IU] | Freq: Once | INTRAMUSCULAR | Status: AC
  Administered 2018-04-10: 2400 [IU]
  Filled 2018-04-10: qty 3

## 2018-04-10 MED ORDER — INSULIN DETEMIR 100 UNIT/ML ~~LOC~~ SOLN
25.0000 [IU] | Freq: Two times a day (BID) | SUBCUTANEOUS | Status: DC
  Administered 2018-04-10 – 2018-04-13 (×6): 25 [IU] via SUBCUTANEOUS
  Filled 2018-04-10 (×6): qty 0.25

## 2018-04-10 MED ORDER — SODIUM PHOSPHATES 45 MMOLE/15ML IV SOLN
30.0000 mmol | Freq: Once | INTRAVENOUS | Status: AC
  Administered 2018-04-10: 30 mmol via INTRAVENOUS
  Filled 2018-04-10: qty 10

## 2018-04-10 MED ORDER — SODIUM CHLORIDE 0.9% IV SOLUTION
Freq: Once | INTRAVENOUS | Status: AC
  Administered 2018-04-10: 10:00:00 via INTRAVENOUS

## 2018-04-10 MED ORDER — CHLORHEXIDINE GLUCONATE CLOTH 2 % EX PADS
6.0000 | MEDICATED_PAD | Freq: Every day | CUTANEOUS | Status: DC
  Administered 2018-04-11: 6 via TOPICAL

## 2018-04-10 MED ORDER — SODIUM CHLORIDE 0.9% IV SOLUTION
Freq: Once | INTRAVENOUS | Status: AC
  Administered 2018-04-10: 13:00:00 via INTRAVENOUS

## 2018-04-10 MED ORDER — SODIUM CHLORIDE 0.9 % IV SOLN
500.0000 mg | INTRAVENOUS | Status: DC
  Administered 2018-04-11 – 2018-04-13 (×3): 500 mg via INTRAVENOUS
  Filled 2018-04-10 (×3): qty 0.5

## 2018-04-10 NOTE — Progress Notes (Signed)
[facility] ROUNDING NOTE   Subjective:    Subjective/HPI:  He has been off of pressors and was taken off of CRRT yesterday (had 250 mL fluid removed 12/26 prior to stopping).  He has had around 350 mL UOP over 12/26.    Review of systems:  Unable to obtain 2/2 mechanical vent   Objective:  Vital signs in last 24 hours:  Temp:  [97.5 F (36.4 C)-101.1 F (38.4 C)] 99.7 F (37.6 C) (12/27 0800) Pulse Rate:  [68-90] 78 (12/27 0700) Resp:  [15-32] 29 (12/27 0700) BP: (109-141)/(46-55) 127/52 (12/27 0700) SpO2:  [96 %-100 %] 97 % (12/27 0823) Arterial Line BP: (100)/(42) 100/42 (12/26 1000) FiO2 (%):  [40 %] 40 % (12/27 0823) Weight:  [84 kg] 84 kg (12/27 0500)  Weight change: 1 kg Filed Weights   04/08/18 0438 04/09/18 0500 04/10/18 0500  Weight: 82.3 kg 83 kg 84 kg    Intake/Output: I/O last 3 completed shifts: In: 3255.7 [I.V.:461.2; NG/GT:2550; IV Piggyback:244.5] Out: 1950 [Urine:519; Other:1131; Stool:300]   Intake/Output this shift:  Total I/O In: 76.4 [NG/GT:65; IV Piggyback:11.4] Out: 100 [Urine:100]  General adult male in bed critically ill  HEENT normocephalic atraumatic  Neck ET tube in place  Lungs coarse mechanical breath sounds Heart  RRR; no rubs Abdomen soft nondistended  Extremities trace to 1+ lower extremity edema  Psych unable to obtain 2/2 mechanical vent; no agitation with exam      Basic Metabolic Panel: Recent Labs  Lab 04/07/18 1600 04/08/18 0447 04/08/18 1629 04/09/18 0520 04/09/18 1641 04/10/18 0509  NA 138 136  137 136 137 138 139  K 5.2* 4.3  4.3 3.9 3.2* 4.3 3.7  CL 105 103  103 105 103 106 105  CO2 13* 17*  17* 20* _0 GLUCOSE 219* 239*  241* 306* 239* 308* 271*  BUN 30* 28*  28* 31* 28* 42* 61*  CREATININE 1.99* 1.78*  1.78* 1.52* 1.29* 1.60* 2.18*  CALCIUM 7.1* 7.1*  7.0* 6.8* 7.1* 6.9* 7.0*  MG 2.4 2.4 2.3 2.4  --  2.1  PHOS 3.0  2.8 2.6  2.6 1.8*  1.7* 1.2*  1.1* 1.3* 1.4*     Liver Function Tests: Recent Labs  Lab 03/16/2018 0145 04/08/2018 0830  04/05/18 0315  04/06/18 0414  04/08/18 0447 04/08/18 1629 04/09/18 0520 04/09/18 1641 04/10/18 0509  AST 258* 232*  --  141*  --  110*  --   --   --   --   --  44*  ALT 123* 118*  --  86*  --  69*  --   --   --   --   --  43  ALKPHOS 129* 124  --  92  --  76  --   --   --   --   --  81  BILITOT 1.8* 2.3*  --  2.1*  --  1.5*  --   --   --   --   --  0.7  PROT 4.8* 4.4*  --  4.3*  --  4.4*  --   --   --   --   --  4.3*  ALBUMIN 2.2* 2.2*   < > 2.1*  2.0*   < > 1.9*   < > 1.7* 1.5* 1.6* 1.4* 1.4*   < > = values in this interval not displayed.    CBC: Recent Labs  Lab 03/24/2018 0145  04/06/18 2141 04/07/18 0312 04/08/18  2563 04/09/18 0520 04/10/18 0509  WBC 27.6*   < > 10.9* 9.8 11.0* 11.7* 13.9*  NEUTROABS 21.5*  --   --   --   --   --   --   HGB 11.2*   < > 7.4* 7.5* 7.1* 7.0* 6.5*  HCT 39.2   < > 22.3* 22.3* 21.6* 20.7* 19.8*  MCV 114.0*   < > 97.4 97.0 100.5* 97.6 97.5  PLT 273   < > 46* 48* 58* 71* 91*   < > = values in this interval not displayed.      Imaging: Dg Chest Port 1 View  Result Date: 04/10/2018 CLINICAL DATA:  Acute respiratory failure EXAM: PORTABLE CHEST 1 VIEW COMPARISON:  04/09/2018 FINDINGS: Endotracheal tube terminates 5.5 cm above the carina. Mild bilateral lower lobe opacities, possibly atelectasis, less likely pneumonia. Appearance is mildly improved. No frank interstitial edema. No pleural effusion or pneumothorax. The heart is normal in size. Fracture venous catheter terminates in the lower SVC. Enteric tube courses into the stomach. IMPRESSION: Endotracheal tube terminates 5.5 cm above the carina. Additional support apparatus as above. Mild bilateral lower lobe opacities, possibly atelectasis, less likely pneumonia. Electronically Signed   By: Julian Hy M.D.   On: 04/10/2018 06:22   Dg Chest Port 1 View  Result Date: 04/09/2018 CLINICAL DATA:  Check gastric  catheter placement EXAM: PORTABLE CHEST 1 VIEW COMPARISON:  Film from earlier in the same day. FINDINGS: Cardiac shadow is stable. Aortic calcifications are again seen. Right jugular central line and endotracheal tube are again seen and stable. Gastric catheter extends into the stomach. The tip is not evaluated on this image. Persistent bibasilar atelectasis is seen. No pneumothorax is noted. IMPRESSION: Tubes and lines as described above. Bibasilar atelectasis is noted. Electronically Signed   By: Inez Catalina M.D.   On: 04/09/2018 01:10   Dg Abd Portable 1v  Result Date: 04/09/2018 CLINICAL DATA:  Check gastric catheter placement EXAM: PORTABLE ABDOMEN - 1 VIEW COMPARISON:  None. FINDINGS: Scattered large and small bowel gas is noted. Nasogastric catheter is noted within the stomach. No free air is seen. Left femoral line is noted. Degenerative changes of lumbar spine are seen. Compression deformities are noted at T12 and L1. IMPRESSION: Chronic changes without acute abnormality. Gastric catheter within the stomach. Electronically Signed   By: Inez Catalina M.D.   On: 04/09/2018 01:11     Medications:   .  prismasol BGK 4/2.5 Stopped (04/09/18 1040)  .  prismasol BGK 4/2.5 Stopped (04/09/18 1040)  . sodium chloride 10 mL/hr at 04/10/18 0700  . sodium chloride    . feeding supplement (VITAL HIGH PROTEIN) 65 mL/hr at 04/10/18 0800  . fentaNYL infusion INTRAVENOUS Stopped (04/09/18 1817)  . heparin 10,000 units/ 20 mL infusion syringe Stopped (04/09/18 1001)  . heparin 999 mL/hr at 04/07/2018 0444  . meropenem (MERREM) IV Stopped (04/10/18 0200)  . prismasol BGK 2/2.5 dialysis solution 1,500 mL/hr at 04/09/18 0734  . sodium phosphate  Dextrose 5% IVPB 43 mL/hr at 04/10/18 0800   . sodium chloride   Intravenous Once  . sodium chloride   Intravenous Once  . aspirin  81 mg Per Tube Daily  . atorvastatin  80 mg Per Tube q1800  . B-complex with vitamin C  1 tablet Oral Daily  . chlorhexidine  gluconate (MEDLINE KIT)  15 mL Mouth Rinse BID  . Chlorhexidine Gluconate Cloth  6 each Topical Daily  . donepezil  10 mg Oral QHS  .  feeding supplement (PRO-STAT SUGAR FREE 64)  30 mL Per Tube Daily  . insulin aspart  0-15 Units Subcutaneous Q4H  . insulin detemir  15 Units Subcutaneous BID  . mouth rinse  15 mL Mouth Rinse 10 times per day  . pantoprazole  40 mg Intravenous Q12H  . sodium chloride flush  10-40 mL Intracatheter Q12H   Place/Maintain arterial line **AND** sodium chloride, acetaminophen (TYLENOL) oral liquid 160 mg/5 mL, albuterol, bisacodyl, fentaNYL, heparin, heparin, heparin, midazolam, midazolam, sodium chloride flush  Assessment/ Plan:   Acute kidney injury presumed no data prior severe hypotension pressors CRRT initiated 04/14/2018.  CRRT stopped 12/21.  Will plan for HD today 12/27 and then assess dialysis needs daily  Hypotension - improved; off pressors  Hyperkalemia - resolved with CRRT  Metabolic acidosis - controlled s/p CRRT    Acute Anemia - note plans for transfusion 1 unit PRBC's.  Will transfuse a second unit of PRBC's with HD today  Hypophos - Secondary in part to CRRT, now off.  S/p repletion (still in process).  Repeat phos level and redose sodium phosphate IV if remains low   GI bleed appreciate gastroenterology input anticoagulation having been stopped IV Protonix started.  Appreciate help from Dr. Tarri Glenn  History of CVA CT head without contrast 04/03/2018 atrophy with mild small vessel ischemic changes of white matter  History of dementia  Echo 0712 diastolic dysfunction  Septic shock continues on vancomycin and meropenem.  Blood cultures no growth.  Tracheal aspirate 04/05/2018 moderate Klebsiella pneumonia     LOS: 6 Claudia Desanctis 04/10/2018  9:04 AM

## 2018-04-10 NOTE — Progress Notes (Signed)
Inpatient Diabetes Program Recommendations  AACE/ADA: New Consensus Statement on Inpatient Glycemic Control (2015)  Target Ranges:  Prepandial:   less than 140 mg/dL      Peak postprandial:   less than 180 mg/dL (1-2 hours)      Critically ill patients:  140 - 180 mg/dL   Lab Results  Component Value Date   GLUCAP 237 (H) 04/10/2018    Review of Glycemic Control Results for Craig Mason, [name] (MRN [id number]) as of 04/10/2018 09:49  Ref. Range 04/09/2018 14:56 04/09/2018 19:54 04/09/2018 23:28 04/10/2018 04:03 04/10/2018 07:41  Glucose-Capillary Latest Ref Range: 70 - 99 mg/dL 562319 (H) 130289 (H) 865276 (H) 256 (H) 237 (H)    Inpatient Diabetes Program Recommendations:   Please consider tube feed coverage 4 units q 4 hrs. (hold if tube feeding held or stopped)  Thank you, Billy FischerJudy E. Divine Hansley, RN, MSN, CDE  Diabetes Coordinator Inpatient Glycemic Control Team Team Pager [phone] (8am-5pm) 04/10/2018 9:49 AM

## 2018-04-10 NOTE — Consult Note (Addendum)
NEURO HOSPITALIST  CONSULT   Requesting Physician: Dr. Vaughan Browner    Chief Complaint: AMS  History obtained from:  chart  HPI:                                                                                                                                         Craig Mason is an 67 y.o. male with PMH, dementia, CVA, DM2, respiratory failure d/t pneumonia with previous trach ( now decannulated) presented to [facility] 12/21 with AMS. Neurology consulted for multiple infarcts seen on CT.  Per chart: patient lives at [facility] Per his sister for the past week she had noticed an AMS. He was becoming less and less interactive and talked less. Per sister at baseline he normally wakes up and is oriented x 2.  Hospital course:  12/21 presented to [facility]. 2L NS bolus and Levo drip started, Vancomycin and cefepime started. Received 1 amp bicarb, 1g calcium gluconate, insulin and bicarb drip and neosynephrine. Vas cath placed and CRRT started. 12/22 GI bleed; heparin stopped 12/23 off pressors 12/25 failed to wean 12/26 CVVH stopped  12/27 CT Head: multiple bilateral infarcts; hgb 6.5   Date last known well: Unable to determine Time last known well: Unable to determine tPA Given: NO: outside of window  Modified Rankin: Rankin Score=2   History reviewed. No pertinent past medical history.  History reviewed. No pertinent surgical history.  History reviewed. No pertinent family history.       Social History:  reports that he has quit smoking. His smoking use included cigarettes and e-cigarettes. He has a 120.00 pack-year smoking history. He has never used smokeless tobacco. No history on file for alcohol and drug.  Allergies: No Known Allergies  Medications:                                                                                                                           Scheduled: . sodium chloride   Intravenous  Once  . aspirin  81 mg Per Tube Daily  . atorvastatin  80 mg Per Tube q1800  .  B-complex with vitamin C  1 tablet Oral Daily  . chlorhexidine gluconate (MEDLINE KIT)  15 mL Mouth Rinse BID  . Chlorhexidine Gluconate Cloth  6 each Topical Daily  . Chlorhexidine Gluconate Cloth  6 each Topical Q0600  . donepezil  10 mg Oral QHS  . feeding supplement (PRO-STAT SUGAR FREE 64)  30 mL Per Tube Daily  . insulin aspart  0-15 Units Subcutaneous Q4H  . insulin detemir  25 Units Subcutaneous BID  . mouth rinse  15 mL Mouth Rinse 10 times per day  . pantoprazole  40 mg Intravenous Q12H  . sodium chloride flush  10-40 mL Intracatheter Q12H   Continuous: . sodium chloride Stopped (04/10/18 1041)  . sodium chloride    . feeding supplement (VITAL HIGH PROTEIN) 65 mL/hr at 04/10/18 1500  . [START ON 04/11/2018] meropenem (MERREM) IV     XHF:SFSEL/TRVUYEBX arterial line **AND** sodium chloride, acetaminophen (TYLENOL) oral liquid 160 mg/5 mL, albuterol, bisacodyl, fentaNYL, midazolam, midazolam, sodium chloride flush   ROS:                                                                                                                                        unobtainable from patient due to mental status  General Examination:                                                                                                      Blood pressure (!) 162/63, pulse 80, temperature 99.3 F (37.4 C), resp. rate (!) 29, height 5' 8" (1.727 m), weight 84 kg, SpO2 100 %.  HEENT-  Normocephalic, no lesions, without obvious abnormality.  Normal external eye and conjunctiva. Cardiovascular- S1-S2 audible,  Lungs-no rhonchi or wheezing noted, no excessive working breathing.  Saturations within normal limits vented. Abdomen- All 4 quadrants palpated and non tender Extremities- Warm, dry and intact Musculoskeletal-generalized pitting edema Skin-warm and dry,intact  Neurological Examination Mental  Status: Intubated, not sedated comatose not following commands. No spontaneous movements noted. Breathing above the vent. Cranial Nerves: Not blinking to threat. Pupils are 2 mm equally sluggish but responsive.  Extraocular movements are intact to doll's eye maneuver. cough present Motor/sensory No grimace to sternal rub. No spontaneous movements noted. Not withdrawing to noxious stimuli in BUE. BLE slight flicker to noxious stimuli.  Deep Tendon Reflexes: too edematous to elicit response Cerebellar: UTA Gait: deferred   Lab Results: Basic Metabolic Panel: Recent Labs  Lab 04/07/18 1600 04/08/18 0447 04/08/18 1629 04/09/18  0520 04/09/18 1641 04/10/18 0509  NA 138 136  137 136 137 138 139  K 5.2* 4.3  4.3 3.9 3.2* 4.3 3.7  CL 105 103  103 105 103 106 105  CO2 13* 17*  17* 20* _0 GLUCOSE 219* 239*  241* 306* 239* 308* 271*  BUN 30* 28*  28* 31* 28* 42* 61*  CREATININE 1.99* 1.78*  1.78* 1.52* 1.29* 1.60* 2.18*  CALCIUM 7.1* 7.1*  7.0* 6.8* 7.1* 6.9* 7.0*  MG 2.4 2.4 2.3 2.4  --  2.1  PHOS 3.0  2.8 2.6  2.6 1.8*  1.7* 1.2*  1.1* 1.3* 1.4*    CBC: Recent Labs  Lab 03/21/2018 0145  04/06/18 2141 04/07/18 0312 04/08/18 0447 04/09/18 0520 04/10/18 0509  WBC 27.6*   < > 10.9* 9.8 11.0* 11.7* 13.9*  NEUTROABS 21.5*  --   --   --   --   --   --   HGB 11.2*   < > 7.4* 7.5* 7.1* 7.0* 6.5*  HCT 39.2   < > 22.3* 22.3* 21.6* 20.7* 19.8*  MCV 114.0*   < > 97.4 97.0 100.5* 97.6 97.5  PLT 273   < > 46* 48* 58* 71* 91*   < > = values in this interval not displayed.    CBG: Recent Labs  Lab 04/09/18 2328 04/10/18 0403 04/10/18 0741 04/10/18 1119 04/10/18 1456  GLUCAP 276* 256* 237* 202* 208*    Imaging: Ct Head Wo Contrast  Result Date: 04/10/2018 CLINICAL DATA:  Dementia.  Altered mental status.  Stroke. EXAM: CT HEAD WITHOUT CONTRAST TECHNIQUE: Contiguous axial images were obtained from the base of the skull through the vertex without intravenous  contrast. COMPARISON:  None. FINDINGS: Brain: There are multiple areas of recent infarction reflected by well-defined hypoattenuation, loss of the gray-white junction and varying degrees of mass effect. There is a large infarction extending from the right basal ganglia throughout the middle and posterior right frontal lobe, involving a significant portion of the right middle cerebral artery territory. There are bilateral occipital parietal and cerebellar infarcts and smaller areas left mid to posterior frontal lobe infarction. Mass effect is reflected by sulcal effacement, and partial effacement of lateral ventricles, right greater than left, with midline shift to the left of 4 mm. There are small foci of dense 9 mmhemorrhage within the right frontal lobe infarction, largest measuring 9 mm in greatest dimension. There is a small focal hypoattenuation in the left pons consistent with a lacunar infarct of unclear chronicity. There is CSF attenuation that widens the left sylvian fissure. This may reflect an arachnoid cyst. There are no parenchymal masses. Vascular: No hyperdense vessel or unexpected calcification. Skull: Normal. Negative for fracture or focal lesion. Sinuses/Orbits: Globes and orbits are unremarkable. Sinuses and mastoid air cells are clear. Other: None. IMPRESSION: 1. Multiple bilateral areas of recent infarction, which appears subacute, involving middle cerebral and posterior cerebral artery territories as well as the cerebellar hemispheres. Largest infarct is on the right involving the right frontal and parietal lobes. Associated mass effect is noted as detailed with midline shift to the left of 4 mm. 2. There are small areas petechial type hemorrhage within the infarcted right frontal lobe. No other intracranial hemorrhage. Electronically Signed   By: Lajean Manes M.D.   On: 04/10/2018 12:30   Dg Chest Port 1 View  Result Date: 04/10/2018 CLINICAL DATA:  Acute respiratory failure EXAM:  PORTABLE CHEST 1 VIEW COMPARISON:  04/09/2018 FINDINGS: Endotracheal  tube terminates 5.5 cm above the carina. Mild bilateral lower lobe opacities, possibly atelectasis, less likely pneumonia. Appearance is mildly improved. No frank interstitial edema. No pleural effusion or pneumothorax. The heart is normal in size. Fracture venous catheter terminates in the lower SVC. Enteric tube courses into the stomach. IMPRESSION: Endotracheal tube terminates 5.5 cm above the carina. Additional support apparatus as above. Mild bilateral lower lobe opacities, possibly atelectasis, less likely pneumonia. Electronically Signed   By: Julian Hy M.D.   On: 04/10/2018 06:22   Dg Chest Port 1 View  Result Date: 04/09/2018 CLINICAL DATA:  Check gastric catheter placement EXAM: PORTABLE CHEST 1 VIEW COMPARISON:  Film from earlier in the same day. FINDINGS: Cardiac shadow is stable. Aortic calcifications are again seen. Right jugular central line and endotracheal tube are again seen and stable. Gastric catheter extends into the stomach. The tip is not evaluated on this image. Persistent bibasilar atelectasis is seen. No pneumothorax is noted. IMPRESSION: Tubes and lines as described above. Bibasilar atelectasis is noted. Electronically Signed   By: Inez Catalina M.D.   On: 04/09/2018 01:10   Dg Abd Portable 1v  Result Date: 04/09/2018 CLINICAL DATA:  Check gastric catheter placement EXAM: PORTABLE ABDOMEN - 1 VIEW COMPARISON:  None. FINDINGS: Scattered large and small bowel gas is noted. Nasogastric catheter is noted within the stomach. No free air is seen. Left femoral line is noted. Degenerative changes of lumbar spine are seen. Compression deformities are noted at T12 and L1. IMPRESSION: Chronic changes without acute abnormality. Gastric catheter within the stomach. Electronically Signed   By: Inez Catalina M.D.   On: 04/09/2018 01:11       Laurey Morale, MSN, NP-C [facility] [phone]  04/10/2018, 3:14 PM   Attending physician note to follow with Assessment and plan .   Assessment: 67 y.o. male  with PMH, dementia, CVA, DM2, respiratory failure d/t pneumonia with previous trach ( now decannulated) presented to [facility] 12/21 with AMS. Neurology consulted for multiple infarcts seen on CTH: multiple bilateral infarcts involving MCA, and PCA and cerebellar hemispheres. Mass effect with midline shift 4 mm. Small petechial hemorrhage in infarcted right frontal lobe. hgb 6.5 BUN 2.18 BUN: 61. Patient with dementia at baseline.   Family was not at the bedside to discuss, however per nursing, they are leaning towards comfort care if he is not waking up by Sunday.  I think that his prognosis is relatively poor given his premorbid condition already requiring SNF and now with fairly severe bilateral infarcts.  These are likely cardioembolic.  Echo without clear source, however.  If family wanted to be aggressive, then I think TEE looking for embolic source and loop recorder would be appropriate, but this is unlikely to be of benefit if he is when to be more of a comfort approach.  Stroke Risk Factors - diabetes mellitus    Recommendations: 1) hold ASA given hemorrhagic infarct 2) repeat CT head in the morning 3) continue family discussions  Roland Rack, MD [facility] [phone]  If 7pm- 7am, please page neurology on call as listed in [place].

## 2018-04-10 NOTE — Progress Notes (Signed)
eLink Physician-Brief Progress Note Patient Name: Craig Mason DOB: June 26, 1950 MRN: [id number]   Date of Service  04/10/2018  HPI/Events of Note  hypophos Drop in Hgb from 7.0 to 6.5  eICU Interventions  Plan: Phos replaced Transfuse 1 u pRBC Post-transfusion CBC     Intervention Category Intermediate Interventions: Other:;Electrolyte abnormality - evaluation and management  Tedrick Port 04/10/2018, 6:04 AM

## 2018-04-10 NOTE — Progress Notes (Signed)
Portable EEG completed, results pending. 

## 2018-04-10 NOTE — Progress Notes (Addendum)
NAME:  Craig Mason, MRN:  [id number], DOB:  01-28-1951, LOS: 6 ADMISSION DATE:  06/06/17, CHIEF COMPLAINT:  Altered mental status  History of present illness   67 year old man, former smoker, with dementia, CVA, T2DM, history of respiratory failure 2/2 pneumonia with previous trach (decannulated now), GERD presenting with altered mental status. Lives at [facility]. Sister noted that his mental status had changed for the last week - less conversant and interactive.  He had acute worsening of mental status on day of admission with episode of emesis. At baseline he is conversant and oriented x 2 (gets confused about month).  Found to be in multiorgan failure, septic shock..  Intubated at [facility] and transferred to [facility] for further management.    Past Medical History  Former smoker, with dementia, history CVA, T2DM, history of respiratory failure 2/2 pneumonia with previous trach (decannulated now), GERD, osteoporosis.  Significant Hospital Events   12/21 Admission, vas cath placement and CRRT initiated. Heparin for elevated troponin 12/22 Developed GI bleed. Heparin stopped 12/23 Off pressors 12/25 Failing weaning trials 12/26 CVVH stopped  Consults:  Nephrology 12/21 Cardiology 12/22. Signed off GI 12/22. Signed off  Procedures:  Rt IJ vas cath placement 12/21 >> Rt radial A line 12/20 > 12/21 Rt fem A line 12/21 > 12/26 Lt fem CVL 12/20 >   Significant Diagnostic Tests:  CT head w/o contrast on 12/20: No CT evidence for acute intracranial abnormality. Atrophy and mild small vessel ischemic changes of the white matter.  Echocardiogram 12/21-mild LVH, EF 55-60%, no regional wall motion abnormality, grade 1 diastolic dysfunction.  LE duplex 12/23 > no DVT; though CFV not visualized due to bandages and poor visualization of popliteals.  Chest x-ray 04/10/18> bilateral lower lobe opacities with atelectasis.  I reviewed the images personally.  Micro Data:  BCx  12/21 No growth Resp Cx 12/21 Kleb (ESBL)  Antimicrobials:  Vanc 12/21 > 12/25 Cefepime 12/21 > 12/24 Meropenem 12/24 >  Interim history/subjective:  Weaning on pressure support 5/5 Not waking her up Hemodynamically stable.  Objective   Blood pressure (!) 138/57, pulse 79, temperature 99.1 F (37.3 C), temperature source Bladder, resp. rate (!) 31, height 5\' 8"  (1.727 m), weight 84 kg, SpO2 97 %.    Vent Mode: PSV;CPAP FiO2 (%):  [40 %] 40 % Set Rate:  [18 bmp] 18 bmp Vt Set:  [430 mL] 430 mL PEEP:  [5 cmH20] 5 cmH20 Pressure Support:  [5 cmH20-8 cmH20] 5 cmH20 Plateau Pressure:  [10 cmH20-14 cmH20] 14 cmH20   Intake/Output Summary (Last 24 hours) at 04/10/2018 1010 Last data filed at 04/10/2018 0954 Gross per 24 hour  Intake 2209.48 ml  Output 412 ml  Net 1797.48 ml   Filed Weights   04/08/18 0438 04/09/18 0500 04/10/18 0500  Weight: 82.3 kg 83 kg 84 kg   Examination:  Gen:      No acute distress HEENT:  EOMI, sclera anicteric Neck:     No masses; no thyromegaly ET tube Lungs:    Clear to auscultation bilaterally; normal respiratory effort CV:         Regular rate and rhythm; no murmurs Abd:      + bowel sounds; soft, non-tender; no palpable masses, no distension Ext:    No edema; adequate peripheral perfusion Skin:      Warm and dry; no rash Neuro: Unresponsive  Assessment & Plan:  Multiorgan failure Septic shock with lactic acidosis.  Now off all pressors Off stress  dose steroids We will remove left fem central line once we get adequate peripheral access.  Respiratory failure ESBL Klebsiella pneumonia infection Continue pressure support weans Mental status is a barrier to extubation Continue meropenem  Elevated troponins, NSTEMI Continue supportive care for now Not a candidate for heart cath due to GI bleed and renal failure Continue ASA, statin  GI bleed Follow CBC.  Transfuse for hemoglobin less than 7 2 unit PRBC today PPI  bid  Thrombocytopenia > improving Follow platelet counts Monitor for signs of bleeding  Acute kidney injury Hyperkalemia and metabolic acidosis with increased anion gap > resolved  Off CRRT today. Follow urine output  Diabetes, hyperglycemia SSI coverage On Levemir. Increase to 25 units bid  CVA, baseline dementia On Aricept Not waking up off sedation. Will order EEG and CT head May need neuro input  Best practice:  Diet: Tube feeds Pain/Anxiety/Delirium protocol (if indicated): Fentanyl, versed PRN VAP protocol (if indicated): ordered DVT prophylaxis: SCDs GI prophylaxis: PPI bid Glucose control: SSI Mobility: Bed rest Code Status: DNR confirmed with sister.  Family Communication: Sister is healthcare POA. Updated her and brother in law at bedside daily Disposition: ICU  The patient is critically ill with multiple organ system failure and requires high complexity decision making for assessment and support, frequent evaluation and titration of therapies, advanced monitoring, review of radiographic studies and interpretation of complex data.   Critical Care Time devoted to patient care services, exclusive of separately billable procedures, described in this note is 35 minutes.   Chilton GreathousePraveen Shamond Skelton MD [facility] Pager [phone] If no answer call [phone] 04/10/2018, 10:28 AM

## 2018-04-10 NOTE — Procedures (Signed)
History: Large bilateral strokes  Sedation: None  Technique: This is a 21 channel routine scalp EEG performed at the bedside with bipolar and monopolar montages arranged in accordance to the international 10/20 system of electrode placement. One channel was dedicated to EKG recording.    Background: The background consists of generalized irregular delta with scant irregular generalized theta.  There is no posterior dominant rhythm seen.    Photic stimulation: Physiologic driving is not performed  EEG Abnormalities: 1) generalized irregular slow activity 2) absent PDR  Clinical Interpretation: This EEG is consistent with a generalized nonspecific cerebral dysfunction (encephalopathy). There was no seizure or seizure predisposition recorded on this study. Please note that lack of epileptiform activity on EEG does not preclude the possibility of epilepsy.   Craig SlotMcNeill Gearldine Looney, MD [facility] [phone]  If 7pm- 7am, please page neurology on call as listed in AMION.

## 2018-04-10 NOTE — Progress Notes (Addendum)
PCCM progress note  Reviewed CT head which shows multiple subacute infarctions with mass-effect, left shift deformity meters.  Small areas of petechial hemorrhage within the infarct of the right frontal lobe.  We will place a consult to neurology Discussed results with his sister and brother-in-law at bedside If there is no possibility of meaningful recovery then they would want withdrawal of care and comfort measures  Chilton GreathousePraveen Tahnee Cifuentes MD [facility] 04/10/2018, 3:11 PM

## 2018-04-11 ENCOUNTER — Inpatient Hospital Stay (HOSPITAL_COMMUNITY): Payer: Medicare (Managed Care)

## 2018-04-11 DIAGNOSIS — J9601 Acute respiratory failure with hypoxia: Secondary | ICD-10-CM

## 2018-04-11 DIAGNOSIS — I6340 Cerebral infarction due to embolism of unspecified cerebral artery: Secondary | ICD-10-CM

## 2018-04-11 LAB — TYPE AND SCREEN
ABO/RH(D): O POS
Antibody Screen: NEGATIVE
UNIT DIVISION: 0
Unit division: 0

## 2018-04-11 LAB — CBC
HCT: 27.8 % — ABNORMAL LOW (ref 39.0–52.0)
Hemoglobin: 9.4 g/dL — ABNORMAL LOW (ref 13.0–17.0)
MCH: 31.2 pg (ref 26.0–34.0)
MCHC: 33.8 g/dL (ref 30.0–36.0)
MCV: 92.4 fL (ref 80.0–100.0)
Platelets: 100 10*3/uL — ABNORMAL LOW (ref 150–400)
RBC: 3.01 MIL/uL — AB (ref 4.22–5.81)
RDW: 16.3 % — ABNORMAL HIGH (ref 11.5–15.5)
WBC: 18.2 10*3/uL — ABNORMAL HIGH (ref 4.0–10.5)
nRBC: 0.8 % — ABNORMAL HIGH (ref 0.0–0.2)

## 2018-04-11 LAB — RENAL FUNCTION PANEL
Albumin: 1.5 g/dL — ABNORMAL LOW (ref 3.5–5.0)
Anion gap: 11 (ref 5–15)
BUN: 46 mg/dL — ABNORMAL HIGH (ref 8–23)
CO2: 26 mmol/L (ref 22–32)
Calcium: 7 mg/dL — ABNORMAL LOW (ref 8.9–10.3)
Chloride: 101 mmol/L (ref 98–111)
Creatinine, Ser: 1.75 mg/dL — ABNORMAL HIGH (ref 0.61–1.24)
GFR calc non Af Amer: 39 mL/min — ABNORMAL LOW (ref >60)
GFR, EST AFRICAN AMERICAN: 45 mL/min — AB (ref >60)
Glucose, Bld: 259 mg/dL — ABNORMAL HIGH (ref 70–99)
Phosphorus: 3.9 mg/dL (ref 2.5–4.6)
Potassium: 3.2 mmol/L — ABNORMAL LOW (ref 3.5–5.1)
Sodium: 138 mmol/L (ref 135–145)

## 2018-04-11 LAB — HEPATITIS B CORE ANTIBODY, TOTAL: Hep B Core Total Ab: NEGATIVE

## 2018-04-11 LAB — BPAM RBC
Blood Product Expiration Date: 202001172359
Blood Product Expiration Date: 202001172359
ISSUE DATE / TIME: 201912270925
ISSUE DATE / TIME: 201912271228
UNIT TYPE AND RH: 5100
Unit Type and Rh: 5100

## 2018-04-11 LAB — GLUCOSE, CAPILLARY
GLUCOSE-CAPILLARY: 208 mg/dL — AB (ref 70–99)
GLUCOSE-CAPILLARY: 270 mg/dL — AB (ref 70–99)
Glucose-Capillary: 163 mg/dL — ABNORMAL HIGH (ref 70–99)
Glucose-Capillary: 209 mg/dL — ABNORMAL HIGH (ref 70–99)
Glucose-Capillary: 240 mg/dL — ABNORMAL HIGH (ref 70–99)
Glucose-Capillary: 266 mg/dL — ABNORMAL HIGH (ref 70–99)

## 2018-04-11 LAB — MAGNESIUM: Magnesium: 1.7 mg/dL (ref 1.7–2.4)

## 2018-04-11 LAB — HEPATITIS B SURFACE ANTIGEN: Hepatitis B Surface Ag: NEGATIVE

## 2018-04-11 MED ORDER — MAGNESIUM SULFATE IN D5W 1-5 GM/100ML-% IV SOLN
1.0000 g | Freq: Once | INTRAVENOUS | Status: AC
  Administered 2018-04-11: 1 g via INTRAVENOUS
  Filled 2018-04-11: qty 100

## 2018-04-11 MED ORDER — POTASSIUM CHLORIDE 10 MEQ/50ML IV SOLN
10.0000 meq | INTRAVENOUS | Status: DC
  Administered 2018-04-11 (×2): 10 meq via INTRAVENOUS
  Filled 2018-04-11 (×2): qty 50

## 2018-04-11 MED ORDER — PANTOPRAZOLE SODIUM 40 MG PO PACK
40.0000 mg | PACK | Freq: Two times a day (BID) | ORAL | Status: DC
  Administered 2018-04-11 – 2018-04-13 (×5): 40 mg
  Filled 2018-04-11 (×4): qty 20

## 2018-04-11 MED ORDER — POTASSIUM CHLORIDE 10 MEQ/50ML IV SOLN
10.0000 meq | INTRAVENOUS | Status: AC
  Administered 2018-04-11 (×2): 10 meq via INTRAVENOUS
  Filled 2018-04-11 (×2): qty 50

## 2018-04-11 NOTE — Progress Notes (Signed)
[facility] ROUNDING NOTE   Subjective:    Subjective/HPI:  Spoke with nursing.  Urine output improving and he had 1.2 liters UOP over 12/27 charted.  He hasn't been following commands or tracking for nursing.  He received HD yesterday with 1 kg UF and had a total of two units of PRBC's yesterday, as well.  Received phos overnight and is about to get supplemental K and mag  Review of systems:  Unable to obtain 2/2 mechanical vent   Objective:  Vital signs in last 24 hours:  Temp:  [98.8 F (37.1 C)-100.2 F (37.9 C)] 99.1 F (37.3 C) (12/28 0600) Pulse Rate:  [70-82] 74 (12/28 0600) Resp:  [25-33] 28 (12/28 0600) BP: (127-164)/(49-69) 140/65 (12/28 0600) SpO2:  [82 %-100 %] 100 % (12/28 0600) FiO2 (%):  [40 %] 40 % (12/28 0418) Weight:  [80.3 kg-84 kg] 80.3 kg (12/28 0429)  Weight change: 0 kg Filed Weights   04/10/18 1511 04/10/18 1841 04/11/18 0429  Weight: 84 kg 83 kg 80.3 kg    Intake/Output: I/O last 3 completed shifts: In: 3537 [I.V.:416.2; Blood:315; NG/GT:2420; IV Piggyback:385.9] Out: 2206 [Urine:959; Other:1247]   Intake/Output this shift:  Total I/O In: 985 [I.V.:10; NG/GT:715; IV Piggyback:260] Out: 47 [Urine:605]  General adult male in bed critically ill  HEENT normocephalic atraumatic  Neck ET tube in place  Lungs coarse mechanical breath sounds Heart  RRR; no rubs Abdomen soft nondistended  Extremities no lower extremity edema; 1+ upper extremity edema Psych unable to obtain 2/2 mechanical vent; no agitation with exam   Right IJ non-tunneled line for HD   Basic Metabolic Panel: Recent Labs  Lab 04/08/18 0447 04/08/18 1629 04/09/18 0520 04/09/18 1641 04/10/18 0509 04/10/18 1600 04/11/18 0416  NA 136  137 136 137 138 139 143 138  K 4.3  4.3 3.9 3.2* 4.3 3.7 3.3* 3.2*  CL 103  103 105 103 106 105 106 101  CO2 17*  17* 20* _0 GLUCOSE 239*  241* 306* 239* 308* 271* 147* 259*  BUN 28*  28* 31* 28* 42* 61*  28* 46*  CREATININE 1.78*  1.78* 1.52* 1.29* 1.60* 2.18* 1.17 1.75*  CALCIUM 7.1*  7.0* 6.8* 7.1* 6.9* 7.0* 7.8* 7.0*  MG 2.4 2.3 2.4  --  2.1  --  1.7  PHOS 2.6  2.6 1.8*  1.7* 1.2*  1.1* 1.3* 1.4* 1.4* 3.9    Liver Function Tests: Recent Labs  Lab 04/14/2018 0830  04/05/18 0315  04/06/18 0414  04/09/18 0520 04/09/18 1641 04/10/18 0509 04/10/18 1600 04/11/18 0416  AST 232*  --  141*  --  110*  --   --   --  44*  --   --   ALT 118*  --  86*  --  69*  --   --   --  43  --   --   ALKPHOS 124  --  92  --  76  --   --   --  81  --   --   BILITOT 2.3*  --  2.1*  --  1.5*  --   --   --  0.7  --   --   PROT 4.4*  --  4.3*  --  4.4*  --   --   --  4.3*  --   --   ALBUMIN 2.2*   < > 2.1*  2.0*   < > 1.9*   < > 1.6*  1.4* 1.4* 1.7* 1.5*   < > = values in this interval not displayed.    CBC: Recent Labs  Lab 04/08/18 0447 04/09/18 0520 04/10/18 0509 04/10/18 1625 04/11/18 0416  WBC 11.0* 11.7* 13.9* 19.5* 18.2*  NEUTROABS  --   --   --  16.7*  --   HGB 7.1* 7.0* 6.5* 10.2* 9.4*  HCT 21.6* 20.7* 19.8* 30.5* 27.8*  MCV 100.5* 97.6 97.5 91.9 92.4  PLT 58* 71* 91* 97* 100*      Imaging: Ct Head Wo Contrast  Result Date: 04/11/2018 CLINICAL DATA:  Stroke follow-up EXAM: CT HEAD WITHOUT CONTRAST TECHNIQUE: Contiguous axial images were obtained from the base of the skull through the vertex without intravenous contrast. COMPARISON:  04/10/2018 FINDINGS: Brain: Extensive hypoattenuation throughout both hemispheres, right greater than left, is unchanged. Areas of petechial hemorrhage within the right MCA territory are unchanged, the largest measuring 10 mm. Leftward midline shift measures 6 mm, previously 4 mm. Areas of infarction within both cerebellar hemispheres are unchanged. Basal cisterns remain patent. No hydrocephalus. Vascular: There is atherosclerotic calcification of the internal carotid and vertebral arteries at the skull base. Skull: Normal. Negative for fracture or focal  lesion. Sinuses/Orbits: No acute finding. Other: None. IMPRESSION: 1. Unchanged appearance of large scale, multifocal, bilateral infarcts, right worse than left. 2. Unchanged areas of petechial hemorrhage within the right MCA territory. 3. Slight increase in leftward midline shift, now measuring 6 mm, previously 4 mm. Electronically Signed   By: Ulyses Jarred M.D.   On: 04/11/2018 06:30   Ct Head Wo Contrast  Result Date: 04/10/2018 CLINICAL DATA:  Dementia.  Altered mental status.  Stroke. EXAM: CT HEAD WITHOUT CONTRAST TECHNIQUE: Contiguous axial images were obtained from the base of the skull through the vertex without intravenous contrast. COMPARISON:  None. FINDINGS: Brain: There are multiple areas of recent infarction reflected by well-defined hypoattenuation, loss of the gray-white junction and varying degrees of mass effect. There is a large infarction extending from the right basal ganglia throughout the middle and posterior right frontal lobe, involving a significant portion of the right middle cerebral artery territory. There are bilateral occipital parietal and cerebellar infarcts and smaller areas left mid to posterior frontal lobe infarction. Mass effect is reflected by sulcal effacement, and partial effacement of lateral ventricles, right greater than left, with midline shift to the left of 4 mm. There are small foci of dense 9 mmhemorrhage within the right frontal lobe infarction, largest measuring 9 mm in greatest dimension. There is a small focal hypoattenuation in the left pons consistent with a lacunar infarct of unclear chronicity. There is CSF attenuation that widens the left sylvian fissure. This may reflect an arachnoid cyst. There are no parenchymal masses. Vascular: No hyperdense vessel or unexpected calcification. Skull: Normal. Negative for fracture or focal lesion. Sinuses/Orbits: Globes and orbits are unremarkable. Sinuses and mastoid air cells are clear. Other: None. IMPRESSION:  1. Multiple bilateral areas of recent infarction, which appears subacute, involving middle cerebral and posterior cerebral artery territories as well as the cerebellar hemispheres. Largest infarct is on the right involving the right frontal and parietal lobes. Associated mass effect is noted as detailed with midline shift to the left of 4 mm. 2. There are small areas petechial type hemorrhage within the infarcted right frontal lobe. No other intracranial hemorrhage. Electronically Signed   By: Lajean Manes M.D.   On: 04/10/2018 12:30   Dg Chest Port 1 View  Result Date: 04/10/2018 CLINICAL DATA:  Acute respiratory failure EXAM: PORTABLE CHEST 1 VIEW COMPARISON:  04/09/2018 FINDINGS: Endotracheal tube terminates 5.5 cm above the carina. Mild bilateral lower lobe opacities, possibly atelectasis, less likely pneumonia. Appearance is mildly improved. No frank interstitial edema. No pleural effusion or pneumothorax. The heart is normal in size. Fracture venous catheter terminates in the lower SVC. Enteric tube courses into the stomach. IMPRESSION: Endotracheal tube terminates 5.5 cm above the carina. Additional support apparatus as above. Mild bilateral lower lobe opacities, possibly atelectasis, less likely pneumonia. Electronically Signed   By: Julian Hy M.D.   On: 04/10/2018 06:22     Medications:   . sodium chloride Stopped (04/10/18 1041)  . sodium chloride    . feeding supplement (VITAL HIGH PROTEIN) 1,000 mL (04/10/18 2106)  . magnesium sulfate 1 - 4 g bolus IVPB    . meropenem (MERREM) IV    . potassium chloride     . sodium chloride   Intravenous Once  . aspirin  81 mg Per Tube Daily  . atorvastatin  80 mg Per Tube q1800  . B-complex with vitamin C  1 tablet Oral Daily  . chlorhexidine gluconate (MEDLINE KIT)  15 mL Mouth Rinse BID  . Chlorhexidine Gluconate Cloth  6 each Topical Daily  . Chlorhexidine Gluconate Cloth  6 each Topical Q0600  . donepezil  10 mg Oral QHS  .  feeding supplement (PRO-STAT SUGAR FREE 64)  30 mL Per Tube Daily  . insulin aspart  0-15 Units Subcutaneous Q4H  . insulin detemir  25 Units Subcutaneous BID  . mouth rinse  15 mL Mouth Rinse 10 times per day  . pantoprazole  40 mg Intravenous Q12H  . sodium chloride flush  10-40 mL Intracatheter Q12H   Place/Maintain arterial line **AND** sodium chloride, acetaminophen (TYLENOL) oral liquid 160 mg/5 mL, albuterol, bisacodyl, fentaNYL, midazolam, midazolam, sodium chloride flush  Assessment/ Plan:   Acute kidney injury presumed no data prior severe hypotension pressors CRRT initiated 03/27/2018.  CRRT stopped 12/21. S/p intermittent HD and last dialyzed on 12/27 to facilitate blood product administration and optimize fluid status.  - No acute indication for dialysis and hope to avoid future HD.  Retain non-tunneled line for now  HTN - improved; off pressors  Hypokalemia.  Hx hyperkalemia resolved with CRRT/HD and now with hypokalemia; team is repleting K and mag  Metabolic acidosis - controlled s/p CRRT    Acute Anemia - improved s/p transfusion of PRBC's x 2 units   Hypophos - Secondary in part to CRRT, now off.  Improved with repletion  GI bleed off anticoagulation and on PPI   CVA CT head 12/27 with subacute infarcts and midline shift   History of dementia  Diastolic CHF per last echo   PNA - Tracheal aspirate 04/05/2018 moderate Klebsiella pneumonia.  On meropenem    LOS: 7 Claudia Desanctis 04/11/2018  6:48 AM

## 2018-04-11 NOTE — Progress Notes (Signed)
STROKE TEAM PROGRESS NOTE   SUBJECTIVE (INTERVAL HISTORY) His RN is at the bedside.  Pt is intubated not on sedation and not responsive. Not open eyes on voice or pain. CT head showed significant large scale bilateral infarcts, explaining for his poor mental status. Agree with palliative care consultation.    OBJECTIVE Vitals:   04/11/18 0900 04/11/18 1000 04/11/18 1100 04/11/18 1200  BP: (!) 132/48 (!) 144/51 (!) 153/67 (!) 139/55  Pulse: 72 73 78 77  Resp: (!) 26 (!) 26 19 (!) 28  Temp: 99.9 F (37.7 C) 99.9 F (37.7 C) 99.7 F (37.6 C)   TempSrc:      SpO2: 97% 98% 98% 94%  Weight:      Height:        CBC:  Recent Labs  Lab 04/10/18 1625 04/11/18 0416  WBC 19.5* 18.2*  NEUTROABS 16.7*  --   HGB 10.2* 9.4*  HCT 30.5* 27.8*  MCV 91.9 92.4  PLT 97* 100*    Basic Metabolic Panel:  Recent Labs  Lab 04/10/18 0509 04/10/18 1600 04/11/18 0416  NA 139 143 138  K 3.7 3.3* 3.2*  CL 105 106 101  CO2 26 31 26   GLUCOSE 271* 147* 259*  BUN 61* 28* 46*  CREATININE 2.18* 1.17 1.75*  CALCIUM 7.0* 7.8* 7.0*  MG 2.1  --  1.7  PHOS 1.4* 1.4* 3.9    Lipid Panel: No results found for: CHOL, TRIG, HDL, CHOLHDL, VLDL, LDLCALC HgbA1c: No results found for: HGBA1C Urine Drug Screen: No results found for: LABOPIA, COCAINSCRNUR, LABBENZ, AMPHETMU, THCU, LABBARB  Alcohol Level No results found for: ETH  IMAGING   Ct Head Wo Contrast 04/11/2018 IMPRESSION:  1. Unchanged appearance of large scale, multifocal, bilateral infarcts, right worse than left.  2. Unchanged areas of petechial hemorrhage within the right MCA territory.  3. Slight increase in leftward midline shift, now measuring 6 mm, previously 4 mm.    Ct Head Wo Contrast 04/10/2018 IMPRESSION:  1. Multiple bilateral areas of recent infarction, which appears subacute, involving middle cerebral and posterior cerebral artery territories as well as the cerebellar hemispheres. Largest infarct is on the right involving  the right frontal and parietal lobes. Associated mass effect is noted as detailed with midline shift to the left of 4 mm.  2. There are small areas petechial type hemorrhage within the infarcted right frontal lobe. No other intracranial hemorrhage.    Dg Chest Port 1 View 04/10/2018 IMPRESSION:  Endotracheal tube terminates 5.5 cm above the carina. Additional support apparatus as above. Mild bilateral lower lobe opacities, possibly atelectasis, less likely pneumonia.    Transthoracic Echocardiogram  July 05, 2017 Study Conclusions  - Left ventricle: The cavity size was normal. Wall thickness was   increased in a pattern of mild LVH. Systolic function was normal.   The estimated ejection fraction was in the range of 55% to 60%.   Wall motion was normal; there were no regional wall motion   abnormalities. Doppler parameters are consistent with abnormal   left ventricular relaxation (grade 1 diastolic dysfunction). - Aortic valve: There was trivial regurgitation. - Mitral valve: Calcified annulus. Impressions: - Technically difficult; normal LV systolic function; mild   diastolic dysfunction; mild LVH; trace AI.   Bilateral LE Venous Dopplers  04/06/2018 Summary: Right: There is no evidence of deep vein thrombosis in the lower extremity. However, portions of this examination were limited- see technologist comments above. No cystic structure found in the popliteal fossa. Left: There is no  evidence of deep vein thrombosis in the lower extremity. However, portions of this examination were limited- see technologist comments above. No cystic structure found in the popliteal fossa.   EEG 04/10/2018 Clinical Interpretation: This EEG is consistent with a generalized nonspecific cerebral dysfunction (encephalopathy). There was no seizure or seizure predisposition recorded on this study. Please note that lack of epileptiform activity on EEG does not preclude the possibility of epilepsy.      PHYSICAL EXAM  Temp:  [98.8 F (37.1 C)-100.2 F (37.9 C)] 100.2 F (37.9 C) (12/28 1200) Pulse Rate:  [70-82] 77 (12/28 1256) Resp:  [19-33] 27 (12/28 1256) BP: (118-164)/(48-69) 139/55 (12/28 1256) SpO2:  [82 %-100 %] 94 % (12/28 1256) FiO2 (%):  [40 %] 40 % (12/28 1256) Weight:  [80.3 kg-84 kg] 80.3 kg (12/28 0429)  General - Well nourished, well developed, intubated not responsive  Ophthalmologic - fundi not visualized due to noncooperation.  Cardiovascular - Regular rate and rhythm.  Neuro - intubated not on sedation, not responsive to voice or pain.  Eyes closed, with eyes forced open, PERRL, doll's eyes present.  Not blinking to visual threat bilaterally.  Not tracking objects.  Absent left corneal reflex, however right corneal reflex present.  Gag reflex intact.  Facial symmetry not able to test due to ET tube.  Tongue midline involved.  On pain stimulation, no moving of all 4 extremities.  DTR diminished, no Babinski. Sensation, coordination and gait not tested.   ASSESSMENT/PLAN [name] is a 67 y.o. male with history of dementia, CVA, DM2, recent GI bleed, respiratory failure d/t pneumonia with previous trach  presenting with AMS. He did not receive IV t-PA due to late presentation.  Strokes: Large bilateral PCA and MCA infarcts with small hemorrhagic conversion - embolic - source unclear  Resultant unresponsive  CT head x 2 - Multiple bilateral areas of recent infarction with small right MCA hemorrhagic conversion - midline shift to the left of 4 mm  B LE Dopplers - limited study but negative for DVT   2D Echo - EF 55 - 60%. No cardiac source of emboli identified.   EEG - nonspecific cerebral dysfunction (encephalopathy).  LDL - pending  HgbA1c - pending  VTE prophylaxis - SCDs  Diet - NPO  aspirin 325 mg daily prior to admission, now on aspirin 81 mg daily  Disposition:  Pending, given multiple severe medical condition and likely poor  neurological outcome, agree with palliative care consultation  Multiple significant medical issues  AKI on CKD - currently on intermittent HD, nephrology on board  Hypotension, was on Levophed and neo, now off  Sepsis with Klebsiella pneumoniae pneumonia with leukocytosis WBC 13.9-19.5 and fever, treated with meropenem  GI bleeding, off heparin for dialysis, on PPI  Severe anemia status post transfusion hemoglobin 6.5-10.2  History of dementia, on Aricept  Respiratory failure, intubated, metabolic acidosis, unresponsive, not able to be extubated at this time.  Given multiple significant medical issues with severe bilateral strokes, patient likelihood of functional recovery is unlikely.  Agree with palliative care consultation  Hypotension  Stable now off pressor . Long-term BP goal normotensive  Hyperlipidemia  Lipid lowering medication PTA:  Lipitor 80 mg daily  LDL pending, goal < 70  Current lipid lowering medication: Lipitor 80 mg daily  Continue statin at discharge  Diabetes  HgbA1c pending, goal < 7.0  Uncontrolled  Hyperglycemia  On Levemir 25 units twice daily  SSI  CBG monitoring  Other Stroke Risk Factors  Advanced age  Former cigarette smoker - quit  Hx stroke/TIA  Other Active Problems  Thrombocytopenia  Hypokalemia  Hospital day # 7  This patient is critically ill due to large scale, bilateral MCA and PCA infarcts, AK on CKD, severe anemia needing blood transfusion, sepsis on antibiotics, hypotension, respiratory failure, GI bleeding and at significant risk of neurological worsening, death form recurrent stroke, hemorrhagic conversion, cerebral edema, brain herniation, heart failure, septic shock. This patient's care requires constant monitoring of vital signs, hemodynamics, respiratory and cardiac monitoring, review of multiple databases, neurological assessment, discussion with family, other specialists and medical decision making of  high complexity. I spent 40 minutes of neurocritical care time in the care of this patient.  Marvel Plan, MD PhD Stroke Neurology 04/11/2018 6:07 PM   To contact Stroke Continuity provider, please refer to [email or web address]. After hours, contact General Neurology

## 2018-04-11 NOTE — Progress Notes (Signed)
eLink Physician-Brief Progress Note Patient Name: Craig Mason DOB: Oct 27, 1950 MRN: [id number]   Date of Service  04/11/2018  HPI/Events of Note  K+ = 3.2, Mg++ = 1.7 and Creatinine = 1.75.  eICU Interventions  Will replace K+ and Mg++.     Intervention Category Major Interventions: Electrolyte abnormality - evaluation and management  Sommer,Steven Eugene 04/11/2018, 6:30 AM

## 2018-04-11 NOTE — Progress Notes (Signed)
RT Note:  Patient suctioned before and transported to and from CT without any complications.

## 2018-04-11 NOTE — Progress Notes (Signed)
NAME:  Craig Mason, MRN:  [id number], DOB:  02-Oct-1950, LOS: 7 ADMISSION DATE:  2017/11/13, CHIEF COMPLAINT:  Altered mental status  History of present illness   67 year old man, former smoker, with dementia, CVA, T2DM, history of respiratory failure 2/2 pneumonia with previous trach (decannulated now), GERD presenting with altered mental status. Lives at [facility]. Sister noted that his mental status had changed for the last week - less conversant and interactive.  He had acute worsening of mental status on day of admission with episode of emesis. At baseline he is conversant and oriented x 2 (gets confused about month).  Found to be in multiorgan failure, septic shock..  Intubated at [facility] and transferred to [facility] for further management.    Past Medical History  Former smoker, with dementia, history CVA, T2DM, history of respiratory failure 2/2 pneumonia with previous trach (decannulated now), GERD, osteoporosis.  Significant Hospital Events   12/21 Admission, vas cath placement and CRRT initiated. Heparin for elevated troponin 12/22 Developed GI bleed. Heparin stopped 12/23 Off pressors 12/25 Failing weaning trials 12/26 CVVH stopped 12/27 subacute infarcts, neuro consulted 12/28 palliative care consulted  Consults:  Nephrology 12/21 Cardiology 12/22. Signed off GI 12/22. Signed off Neurology 12/27  Procedures:  Rt IJ vas cath placement 12/21 >> Rt radial A line 12/20 > 12/21 Rt fem A line 12/21 > 12/26 Lt fem CVL 12/20 >   Significant Diagnostic Tests:  CT head w/o contrast on 12/20: No CT evidence for acute intracranial abnormality. Atrophy and mild small vessel ischemic changes of the white matter.  Echocardiogram 12/21-mild LVH, EF 55-60%, no regional wall motion abnormality, grade 1 diastolic dysfunction.  LE duplex 12/23 > no DVT; though CFV not visualized due to bandages and poor visualization of popliteals.  CT head 12/27: multiple b/l  subacute infarcts, 4 mm Rt to Lt midline shift  Micro Data:  BCx 12/21 No growth Resp Cx 12/21 Kleb (ESBL)  Antimicrobials:  Vanc 12/21 > 12/25 Cefepime 12/21 > 12/24 Meropenem 12/24 >  Interim history/subjective:  Received one dose of versed this AM for tachypnea.  No other sedation.  Remains on vent.  Objective   Blood pressure 118/66, pulse 75, temperature 99.7 F (37.6 C), temperature source Bladder, resp. rate (!) 28, height 5\' 8"  (1.727 m), weight 80.3 kg, SpO2 100 %.    Vent Mode: PSV;CPAP FiO2 (%):  [40 %] 40 % Set Rate:  [18 bmp] 18 bmp Vt Set:  [430 mL] 430 mL PEEP:  [5 cmH20] 5 cmH20 Pressure Support:  [10 cmH20] 10 cmH20 Plateau Pressure:  [10 cmH20-14 cmH20] 13 cmH20   Intake/Output Summary (Last 24 hours) at 04/11/2018 0956 Last data filed at 04/11/2018 0800 Gross per 24 hour  Intake 2023.03 ml  Output 2070 ml  Net -46.97 ml   Filed Weights   04/10/18 1511 04/10/18 1841 04/11/18 0429  Weight: 84 kg 83 kg 80.3 kg   Examination:   General - unresponsive Eyes - downward gaze ENT - ETT in place Cardiac - regular rate/rhythm, no murmur Chest - scattered rhonchi Abdomen - soft, non tender, decreased bowel sounds Extremities - 2+ edema Skin - multiple areas of ecchymosis Neuro - decorticate posturing with painful stimuli   Assessment & Plan:   Acute respiratory failure from ESBL Klebsiella pneumonia with septic shock. Plan - pressure support - mental status precludes extubation trial  Septic shock with multiorgan failure from pneumonia. Plan - continue ABx  NSTEMI. Plan - continue ASA  GI bleed. Plan - bid protonix  Thrombocytopenia. Plan - f/u CBC intermittently  Acute renal failure from ATN. Plan - monitor off dialysis  DM with hyperglycemia. Plan  - SSI  CVA. Hx of dementia. Plan - poor prognosis for neuro recovery  Best practice:  Diet: Tube feeds DVT prophylaxis: SCDs GI prophylaxis: protonix Mobility: Bed  rest Code Status: DNR  Family Communication: no family at bedside  Labs:   CMP Latest Ref Rng & Units 04/11/2018 04/10/2018 04/10/2018  Glucose 70 - 99 mg/dL 161(W259(H) 960(A147(H) 540(J271(H)  BUN 8 - 23 mg/dL 81(X46(H) 91(Y28(H) 78(G61(H)  Creatinine 0.61 - 1.24 mg/dL 9.56(O1.75(H) 1.301.17 8.65(H2.18(H)  Sodium 135 - 145 mmol/L 138 143 139  Potassium 3.5 - 5.1 mmol/L 3.2(L) 3.3(L) 3.7  Chloride 98 - 111 mmol/L 101 106 105  CO2 22 - 32 mmol/L 26 31 26   Calcium 8.9 - 10.3 mg/dL 7.0(L) 7.8(L) 7.0(L)  Total Protein 6.5 - 8.1 g/dL - - 4.3(L)  Total Bilirubin 0.3 - 1.2 mg/dL - - 0.7  Alkaline Phos 38 - 126 U/L - - 81  AST 15 - 41 U/L - - 44(H)  ALT 0 - 44 U/L - - 43   CBC Latest Ref Rng & Units 04/11/2018 04/10/2018 04/10/2018  WBC 4.0 - 10.5 K/uL 18.2(H) 19.5(H) 13.9(H)  Hemoglobin 13.0 - 17.0 g/dL 8.4(O9.4(L) 10.2(L) 6.5(LL)  Hematocrit 39.0 - 52.0 % 27.8(L) 30.5(L) 19.8(L)  Platelets 150 - 400 K/uL 100(L) 97(L) 91(L)   ABG    Component Value Date/Time   PHART 7.486 (H) 04/09/2018 0314   PCO2ART 33.3 04/09/2018 0314   PO2ART 67.0 (L) 04/09/2018 0314   HCO3 25.2 04/09/2018 0314   TCO2 26 04/09/2018 0314   ACIDBASEDEF 7.0 (H) 04/08/2018 0459   O2SAT 95.0 04/09/2018 0314   CBG (last 3)  Recent Labs    04/10/18 2321 04/11/18 0416 04/11/18 0730  GLUCAP 185* 240* 208*    CC time 32 minutes  Coralyn HellingVineet Avalene Sealy, MD [place] 04/11/2018, 10:09 AM

## 2018-04-12 ENCOUNTER — Inpatient Hospital Stay (HOSPITAL_COMMUNITY): Payer: Medicare (Managed Care)

## 2018-04-12 DIAGNOSIS — R509 Fever, unspecified: Secondary | ICD-10-CM

## 2018-04-12 DIAGNOSIS — D72829 Elevated white blood cell count, unspecified: Secondary | ICD-10-CM

## 2018-04-12 DIAGNOSIS — I959 Hypotension, unspecified: Secondary | ICD-10-CM

## 2018-04-12 LAB — MAGNESIUM: Magnesium: 1.8 mg/dL (ref 1.7–2.4)

## 2018-04-12 LAB — HEMOGLOBIN A1C
Hgb A1c MFr Bld: 6.6 % — ABNORMAL HIGH (ref 4.8–5.6)
Mean Plasma Glucose: 142.72 mg/dL

## 2018-04-12 LAB — RENAL FUNCTION PANEL
Albumin: 1.4 g/dL — ABNORMAL LOW (ref 3.5–5.0)
Anion gap: 11 (ref 5–15)
BUN: 67 mg/dL — ABNORMAL HIGH (ref 8–23)
CHLORIDE: 104 mmol/L (ref 98–111)
CO2: 26 mmol/L (ref 22–32)
Calcium: 7.1 mg/dL — ABNORMAL LOW (ref 8.9–10.3)
Creatinine, Ser: 2.28 mg/dL — ABNORMAL HIGH (ref 0.61–1.24)
GFR calc Af Amer: 33 mL/min — ABNORMAL LOW (ref >60)
GFR calc non Af Amer: 28 mL/min — ABNORMAL LOW (ref >60)
Glucose, Bld: 170 mg/dL — ABNORMAL HIGH (ref 70–99)
Phosphorus: 3.3 mg/dL (ref 2.5–4.6)
Potassium: 3.6 mmol/L (ref 3.5–5.1)
Sodium: 141 mmol/L (ref 135–145)

## 2018-04-12 LAB — GLUCOSE, CAPILLARY
GLUCOSE-CAPILLARY: 154 mg/dL — AB (ref 70–99)
GLUCOSE-CAPILLARY: 159 mg/dL — AB (ref 70–99)
Glucose-Capillary: 145 mg/dL — ABNORMAL HIGH (ref 70–99)
Glucose-Capillary: 147 mg/dL — ABNORMAL HIGH (ref 70–99)
Glucose-Capillary: 185 mg/dL — ABNORMAL HIGH (ref 70–99)

## 2018-04-12 LAB — CBC
HEMATOCRIT: 28.3 % — AB (ref 39.0–52.0)
Hemoglobin: 9.3 g/dL — ABNORMAL LOW (ref 13.0–17.0)
MCH: 30.5 pg (ref 26.0–34.0)
MCHC: 32.9 g/dL (ref 30.0–36.0)
MCV: 92.8 fL (ref 80.0–100.0)
Platelets: 157 10*3/uL (ref 150–400)
RBC: 3.05 MIL/uL — AB (ref 4.22–5.81)
RDW: 15.9 % — ABNORMAL HIGH (ref 11.5–15.5)
WBC: 24.4 10*3/uL — ABNORMAL HIGH (ref 4.0–10.5)
nRBC: 0.1 % (ref 0.0–0.2)

## 2018-04-12 LAB — LIPID PANEL
CHOLESTEROL: 53 mg/dL (ref 0–200)
HDL: 19 mg/dL — ABNORMAL LOW (ref >40)
LDL Cholesterol: 23 mg/dL (ref 0–99)
Total CHOL/HDL Ratio: 2.8 RATIO
Triglycerides: 55 mg/dL (ref <150)
VLDL: 11 mg/dL (ref 0–40)

## 2018-04-12 NOTE — Progress Notes (Signed)
No charge note:   Thank you for this consult.  Chart reviewed. Patient admitted with sepsis, intubated, S/P CRRT- kidney function stable for now. Failing weaning trials. Now with subacute infarcts and midline shift that is worsened on repeat scan. Palliative medicine consulted for GOC.   No family at bedside.  Called patient's sister- Benay PillowCarolyn Masters at listed phone number- left message requesting return call.   Ocie BobKasie Hadley Soileau, AGNP-C Palliative Medicine  Please call Palliative Medicine team phone with any questions [phone]. For individual providers please see AMION.

## 2018-04-12 NOTE — Progress Notes (Signed)
NAME:  Craig Mason, MRN:  [id number], DOB:  14-Jul-1950, LOS: 8 ADMISSION DATE:  04/01/2018, CHIEF COMPLAINT:  Altered mental status  History of present illness   67 year old man, former smoker, with dementia, CVA, T2DM, history of respiratory failure 2/2 pneumonia with previous trach (decannulated now), GERD presenting with altered mental status. Lives at [facility]. Sister noted that his mental status had changed for the last week - less conversant and interactive.  He had acute worsening of mental status on day of admission with episode of emesis. At baseline he is conversant and oriented x 2 (gets confused about month).  Found to be in multiorgan failure, septic shock..  Intubated at [facility] and transferred to [facility] for further management.    Past Medical History  Former smoker, with dementia, history CVA, T2DM, history of respiratory failure 2/2 pneumonia with previous trach (decannulated now), GERD, osteoporosis.  Significant Hospital Events   12/21 Admission, vas cath placement and CRRT initiated. Heparin for elevated troponin 12/22 Developed GI bleed. Heparin stopped 12/23 Off pressors 12/25 Failing weaning trials 12/26 CVVH stopped 12/27 subacute infarcts, neuro consulted 12/28 palliative care consulted  Consults:  Nephrology 12/21 Cardiology 12/22. Signed off GI 12/22. Signed off Neurology 12/27  Procedures:  Rt IJ vas cath placement 12/21 >> Rt radial A line 12/20 > 12/21 Rt fem A line 12/21 > 12/26 Lt fem CVL 12/20 >   Significant Diagnostic Tests:  CT head w/o contrast on 12/20: No CT evidence for acute intracranial abnormality. Atrophy and mild small vessel ischemic changes of the white matter.  Echocardiogram 12/21-mild LVH, EF 55-60%, no regional wall motion abnormality, grade 1 diastolic dysfunction.  LE duplex 12/23 > no DVT; though CFV not visualized due to bandages and poor visualization of popliteals.  CT head 12/27: multiple b/l  subacute infarcts, 4 mm Rt to Lt midline shift  Micro Data:  BCx 12/21 No growth Resp Cx 12/21 Kleb (ESBL)  Antimicrobials:  Vanc 12/21 > 12/25 Cefepime 12/21 > 12/24 Meropenem 12/24 >  Interim history/subjective:  Remains on full vent support.  Objective   Blood pressure (!) 128/48, pulse 83, temperature (!) 100.9 F (38.3 C), resp. rate (!) 33, height _0  (1.626 m), weight 79.8 kg, SpO2 96 %.    Vent Mode: PRVC FiO2 (%):  [40 %-50 %] 50 % Set Rate:  [18 bmp] 18 bmp Vt Set:  [470 mL] 470 mL PEEP:  [5 cmH20] 5 cmH20 Pressure Support:  [10 cmH20] 10 cmH20 Plateau Pressure:  [15 cmH20-23 cmH20] 23 cmH20   Intake/Output Summary (Last 24 hours) at 04/12/2018 1052 Last data filed at 04/12/2018 1000 Gross per 24 hour  Intake 2120.02 ml  Output 1355 ml  Net 765.02 ml   Filed Weights   04/10/18 1841 04/11/18 0429 04/12/18 0500  Weight: 83 kg 80.3 kg 79.8 kg   Examination:   General - unresponsive Eyes - pupils weakly reactive ENT - ETT in place Cardiac - regular rate/rhythm, no murmur Chest - scattered rhonchi Abdomen - soft, non tender, + bowel sounds Extremities - 1+ edema Skin - stage 1 pressure wound on scalp Neuro - doesn't follow commands  CXR 12/29 (reviewed by me) >> low lung volumes   Assessment & Plan:   Acute respiratory failure from ESBL Klebsiella pneumonia with septic shock. Plan - change to pressure control - not candidate for extubation due to mental status - f/u CXR intermittently  Septic shock with multiorgan failure from pneumonia. Plan - day  6 of meropenem  NSTEMI. Plan - continue ASA, lipitor  GI bleed. Plan - bid protonix  Thrombocytopenia. Plan - f/u CBC intermittently  Acute renal failure from ATN. Plan - renal following  DM with hyperglycemia. Plan  - SSI  CVA. Hx of dementia. Plan - no signs for neuro recovery so far - palliative care consulted - neurology following   Best practice:  Diet: Tube feeds DVT  prophylaxis: SCDs GI prophylaxis: protonix Mobility: Bed rest Code Status: DNR  Family Communication: met with pt's sister 12/29.  Discussed concern for lack of neuro recovery.  She is agreeable to have further discussions with palliative care and would want more input from neurology before making further decisions about goals of care.  Labs:   CMP Latest Ref Rng & Units 04/12/2018 04/11/2018 04/10/2018  Glucose 70 - 99 mg/dL 170(H) 259(H) 147(H)  BUN 8 - 23 mg/dL 67(H) 46(H) 28(H)  Creatinine 0.61 - 1.24 mg/dL 2.28(H) 1.75(H) 1.17  Sodium 135 - 145 mmol/L 141 138 143  Potassium 3.5 - 5.1 mmol/L 3.6 3.2(L) 3.3(L)  Chloride 98 - 111 mmol/L 104 101 106  CO2 22 - 32 mmol/L _0 Calcium 8.9 - 10.3 mg/dL 7.1(L) 7.0(L) 7.8(L)  Total Protein 6.5 - 8.1 g/dL - - -  Total Bilirubin 0.3 - 1.2 mg/dL - - -  Alkaline Phos 38 - 126 U/L - - -  AST 15 - 41 U/L - - -  ALT 0 - 44 U/L - - -   CBC Latest Ref Rng & Units 04/12/2018 04/11/2018 04/10/2018  WBC 4.0 - 10.5 K/uL 24.4(H) 18.2(H) 19.5(H)  Hemoglobin 13.0 - 17.0 g/dL 9.3(L) 9.4(L) 10.2(L)  Hematocrit 39.0 - 52.0 % 28.3(L) 27.8(L) 30.5(L)  Platelets 150 - 400 K/uL 157 100(L) 97(L)   ABG    Component Value Date/Time   PHART 7.486 (H) 04/09/2018 0314   PCO2ART 33.3 04/09/2018 0314   PO2ART 67.0 (L) 04/09/2018 0314   HCO3 25.2 04/09/2018 0314   TCO2 26 04/09/2018 0314   ACIDBASEDEF 7.0 (H) 04/08/2018 0459   O2SAT 95.0 04/09/2018 0314   CBG (last 3)  Recent Labs    04/11/18 2352 04/12/18 0420 04/12/18 0858  GLUCAP 163* 145* 147*    CC time 36 minutes  Chesley Mires, MD [place] 04/12/2018, 10:52 AM

## 2018-04-12 NOTE — Progress Notes (Signed)
[facility] ROUNDING NOTE   Subjective:    Subjective/HPI:   Spoke with RN at bedside.  Last received HD on 12/27 with 1 kg UF at that time.  Had 700 mL UOP over 12/28.   Neuro following and note their support of palliative care consultation  Review of systems:  Unable to obtain 2/2 mechanical ventilation    Objective:  Vital signs in last 24 hours:  Temp:  [99.5 F (37.5 C)-101.3 F (38.5 C)] 100.6 F (38.1 C) (12/29 0500) Pulse Rate:  [72-85] 77 (12/29 0500) Resp:  [19-35] 33 (12/29 0500) BP: (113-165)/(46-83) 138/83 (12/29 0500) SpO2:  [90 %-100 %] 92 % (12/29 0500) FiO2 (%):  [40 %] 40 % (12/29 0447) Weight:  [79.8 kg] 79.8 kg (12/29 0500)  Weight change: -4.2 kg Filed Weights   04/10/18 1841 04/11/18 0429 04/12/18 0500  Weight: 83 kg 80.3 kg 79.8 kg    Intake/Output: I/O last 3 completed shifts: In: 3477.8 [I.V.:71.7; Blood:315; NG/GT:2340; IV Piggyback:751.1] Out: 2220 [Urine:1220; Other:1000]   Intake/Output this shift:  Total I/O In: 725 [I.V.:10; NG/GT:715] Out: 1105 [Urine:680; Stool:425]  General adult male in bed critically ill HEENT normocephalic atraumatic  Neck ET tube in place  Lungs coarse mechanical breath sounds Heart  RRR; no rubs Abdomen soft nondistended  Extremities no lower extremity edema appreciated; pt with 1+ upper extremity edema  Psych unable to obtain 2/2 mechanical vent; no agitation with exam   Right IJ non-tunneled line for HD in place; dressed   Basic Metabolic Panel: Recent Labs  Lab 04/08/18 1629 04/09/18 0520 04/09/18 1641 04/10/18 0509 04/10/18 1600 04/11/18 0416 04/12/18 0455  NA 136 137 138 139 143 138 141  K 3.9 3.2* 4.3 3.7 3.3* 3.2* 3.6  CL 105 103 106 105 106 101 104  CO2 20* '26 28 26 31 26 26  ' GLUCOSE 306* 239* 308* 271* 147* 259* 170*  BUN 31* 28* 42* 61* 28* 46* 67*  CREATININE 1.52* 1.29* 1.60* 2.18* 1.17 1.75* 2.28*  CALCIUM 6.8* 7.1* 6.9* 7.0* 7.8* 7.0* 7.1*  MG 2.3 2.4  --  2.1   --  1.7 1.8  PHOS 1.8*  1.7* 1.2*  1.1* 1.3* 1.4* 1.4* 3.9 3.3    Liver Function Tests: Recent Labs  Lab 04/06/18 0414  04/09/18 1641 04/10/18 0509 04/10/18 1600 04/11/18 0416 04/12/18 0455  AST 110*  --   --  44*  --   --   --   ALT 69*  --   --  43  --   --   --   ALKPHOS 76  --   --  81  --   --   --   BILITOT 1.5*  --   --  0.7  --   --   --   PROT 4.4*  --   --  4.3*  --   --   --   ALBUMIN 1.9*   < > 1.4* 1.4* 1.7* 1.5* 1.4*   < > = values in this interval not displayed.    CBC: Recent Labs  Lab 04/09/18 0520 04/10/18 0509 04/10/18 1625 04/11/18 0416 04/12/18 0455  WBC 11.7* 13.9* 19.5* 18.2* 24.4*  NEUTROABS  --   --  16.7*  --   --   HGB 7.0* 6.5* 10.2* 9.4* 9.3*  HCT 20.7* 19.8* 30.5* 27.8* 28.3*  MCV 97.6 97.5 91.9 92.4 92.8  PLT 71* 91* 97* 100* 157      Imaging: Ct Head Wo Contrast  Result Date: 04/11/2018 CLINICAL DATA:  Stroke follow-up EXAM: CT HEAD WITHOUT CONTRAST TECHNIQUE: Contiguous axial images were obtained from the base of the skull through the vertex without intravenous contrast. COMPARISON:  04/10/2018 FINDINGS: Brain: Extensive hypoattenuation throughout both hemispheres, right greater than left, is unchanged. Areas of petechial hemorrhage within the right MCA territory are unchanged, the largest measuring 10 mm. Leftward midline shift measures 6 mm, previously 4 mm. Areas of infarction within both cerebellar hemispheres are unchanged. Basal cisterns remain patent. No hydrocephalus. Vascular: There is atherosclerotic calcification of the internal carotid and vertebral arteries at the skull base. Skull: Normal. Negative for fracture or focal lesion. Sinuses/Orbits: No acute finding. Other: None. IMPRESSION: 1. Unchanged appearance of large scale, multifocal, bilateral infarcts, right worse than left. 2. Unchanged areas of petechial hemorrhage within the right MCA territory. 3. Slight increase in leftward midline shift, now measuring 6 mm,  previously 4 mm. Electronically Signed   By: Ulyses Jarred M.D.   On: 04/11/2018 06:30   Ct Head Wo Contrast  Result Date: 04/10/2018 CLINICAL DATA:  Dementia.  Altered mental status.  Stroke. EXAM: CT HEAD WITHOUT CONTRAST TECHNIQUE: Contiguous axial images were obtained from the base of the skull through the vertex without intravenous contrast. COMPARISON:  None. FINDINGS: Brain: There are multiple areas of recent infarction reflected by well-defined hypoattenuation, loss of the gray-white junction and varying degrees of mass effect. There is a large infarction extending from the right basal ganglia throughout the middle and posterior right frontal lobe, involving a significant portion of the right middle cerebral artery territory. There are bilateral occipital parietal and cerebellar infarcts and smaller areas left mid to posterior frontal lobe infarction. Mass effect is reflected by sulcal effacement, and partial effacement of lateral ventricles, right greater than left, with midline shift to the left of 4 mm. There are small foci of dense 9 mmhemorrhage within the right frontal lobe infarction, largest measuring 9 mm in greatest dimension. There is a small focal hypoattenuation in the left pons consistent with a lacunar infarct of unclear chronicity. There is CSF attenuation that widens the left sylvian fissure. This may reflect an arachnoid cyst. There are no parenchymal masses. Vascular: No hyperdense vessel or unexpected calcification. Skull: Normal. Negative for fracture or focal lesion. Sinuses/Orbits: Globes and orbits are unremarkable. Sinuses and mastoid air cells are clear. Other: None. IMPRESSION: 1. Multiple bilateral areas of recent infarction, which appears subacute, involving middle cerebral and posterior cerebral artery territories as well as the cerebellar hemispheres. Largest infarct is on the right involving the right frontal and parietal lobes. Associated mass effect is noted as detailed  with midline shift to the left of 4 mm. 2. There are small areas petechial type hemorrhage within the infarcted right frontal lobe. No other intracranial hemorrhage. Electronically Signed   By: Lajean Manes M.D.   On: 04/10/2018 12:30     Medications:   . sodium chloride Stopped (04/10/18 1041)  . feeding supplement (VITAL HIGH PROTEIN) 1,000 mL (04/11/18 1644)  . meropenem (MERREM) IV Stopped (04/11/18 1121)   . aspirin  81 mg Per Tube Daily  . atorvastatin  80 mg Per Tube q1800  . B-complex with vitamin C  1 tablet Oral Daily  . chlorhexidine gluconate (MEDLINE KIT)  15 mL Mouth Rinse BID  . Chlorhexidine Gluconate Cloth  6 each Topical Daily  . donepezil  10 mg Oral QHS  . feeding supplement (PRO-STAT SUGAR FREE 64)  30 mL Per Tube Daily  .  insulin aspart  0-15 Units Subcutaneous Q4H  . insulin detemir  25 Units Subcutaneous BID  . mouth rinse  15 mL Mouth Rinse 10 times per day  . pantoprazole sodium  40 mg Per Tube BID  . sodium chloride flush  10-40 mL Intracatheter Q12H   acetaminophen (TYLENOL) oral liquid 160 mg/5 mL, albuterol, bisacodyl, fentaNYL, midazolam, sodium chloride flush  Assessment/ Plan:   Acute kidney injury presumed (no BL available) secondary to ischemic ATN/severe hypotension requiring pressors.  Initially on CRRT then transitioned to [facility].  Last dialyzed on 12/27 to facilitate blood product administration and optimize fluid status.  - Continue supportive care.  No acute indication for dialysis and hope to avoid future HD.  Retain non-tunneled line for now with interval rise in creatinine   HTN - improved control   Hypokalemia.  Resolved.  Note hx hyperkalemia resolved with CRRT/HD  Metabolic acidosis - resolved after RRT  Acute Anemia - improved s/p transfusion PRBC's  Hypophos - Secondary in part to CRRT, now off.  resolved  GI bleed off anticoagulation and on PPI   CVA CT head 12/27 with subacute infarcts and mass effect with midline shift.    History of dementia  Diastolic CHF per last echo   PNA - Tracheal aspirate 04/05/2018 moderate Klebsiella pneumonia.  On meropenem    LOS: 8 Claudia Desanctis 04/12/2018  6:30 AM

## 2018-04-12 NOTE — Progress Notes (Signed)
STROKE TEAM PROGRESS NOTE   SUBJECTIVE (INTERVAL HISTORY) His sister is at the bedside.  Pt still intubated not on sedation and not responsive. Discussed with sister about pt poor prognosis, and sister is leaning towards comfort care. Still pending palliative care service talking with sister.    OBJECTIVE Vitals:   04/12/18 0300 04/12/18 0400 04/12/18 0447 04/12/18 0500  BP: (!) 113/53 (!) 122/46  138/83  Pulse: 77 78  77  Resp: (!) 35 (!) 31  (!) 33  Temp: (!) 100.8 F (38.2 C) (!) 100.8 F (38.2 C)  (!) 100.6 F (38.1 C)  TempSrc:      SpO2: 90% 96% 91% 92%  Weight:    79.8 kg  Height:        CBC:  Recent Labs  Lab 04/10/18 1625 04/11/18 0416 04/12/18 0455  WBC 19.5* 18.2* 24.4*  NEUTROABS 16.7*  --   --   HGB 10.2* 9.4* 9.3*  HCT 30.5* 27.8* 28.3*  MCV 91.9 92.4 92.8  PLT 97* 100* 157    Basic Metabolic Panel:  Recent Labs  Lab 04/11/18 0416 04/12/18 0455  NA 138 141  K 3.2* 3.6  CL 101 104  CO2 26 26  GLUCOSE 259* 170*  BUN 46* 67*  CREATININE 1.75* 2.28*  CALCIUM 7.0* 7.1*  MG 1.7 1.8  PHOS 3.9 3.3    Lipid Panel:     Component Value Date/Time   CHOL 53 04/12/2018 0035   TRIG 55 04/12/2018 0035   HDL 19 (L) 04/12/2018 0035   CHOLHDL 2.8 04/12/2018 0035   VLDL 11 04/12/2018 0035   LDLCALC 23 04/12/2018 0035   HgbA1c:  Lab Results  Component Value Date   HGBA1C 6.6 (H) 04/12/2018   Urine Drug Screen: No results found for: LABOPIA, COCAINSCRNUR, LABBENZ, AMPHETMU, THCU, LABBARB  Alcohol Level No results found for: ETH  IMAGING   Ct Head Wo Contrast 04/11/2018 IMPRESSION:  1. Unchanged appearance of large scale, multifocal, bilateral infarcts, right worse than left.  2. Unchanged areas of petechial hemorrhage within the right MCA territory.  3. Slight increase in leftward midline shift, now measuring 6 mm, previously 4 mm.    Ct Head Wo Contrast 04/10/2018 IMPRESSION:  1. Multiple bilateral areas of recent infarction, which appears  subacute, involving middle cerebral and posterior cerebral artery territories as well as the cerebellar hemispheres. Largest infarct is on the right involving the right frontal and parietal lobes. Associated mass effect is noted as detailed with midline shift to the left of 4 mm.  2. There are small areas petechial type hemorrhage within the infarcted right frontal lobe. No other intracranial hemorrhage.    Dg Chest Port 1 View 04/10/2018 IMPRESSION:  Endotracheal tube terminates 5.5 cm above the carina. Additional support apparatus as above. Mild bilateral lower lobe opacities, possibly atelectasis, less likely pneumonia.    Transthoracic Echocardiogram  02/04/2018 Study Conclusions  - Left ventricle: The cavity size was normal. Wall thickness was   increased in a pattern of mild LVH. Systolic function was normal.   The estimated ejection fraction was in the range of 55% to 60%.   Wall motion was normal; there were no regional wall motion   abnormalities. Doppler parameters are consistent with abnormal   left ventricular relaxation (grade 1 diastolic dysfunction). - Aortic valve: There was trivial regurgitation. - Mitral valve: Calcified annulus. Impressions: - Technically difficult; normal LV systolic function; mild   diastolic dysfunction; mild LVH; trace AI.   Bilateral LE Venous  Dopplers  04/06/2018 Summary: Right: There is no evidence of deep vein thrombosis in the lower extremity. However, portions of this examination were limited- see technologist comments above. No cystic structure found in the popliteal fossa. Left: There is no evidence of deep vein thrombosis in the lower extremity. However, portions of this examination were limited- see technologist comments above. No cystic structure found in the popliteal fossa.   EEG 04/10/2018 Clinical Interpretation: This EEG is consistent with a generalized nonspecific cerebral dysfunction (encephalopathy). There was no seizure  or seizure predisposition recorded on this study. Please note that lack of epileptiform activity on EEG does not preclude the possibility of epilepsy.     PHYSICAL EXAM  Temp:  [99.7 F (37.6 C)-101.3 F (38.5 C)] 100.6 F (38.1 C) (12/29 0500) Pulse Rate:  [72-85] 77 (12/29 0500) Resp:  [19-35] 33 (12/29 0500) BP: (113-165)/(46-83) 138/83 (12/29 0500) SpO2:  [90 %-100 %] 92 % (12/29 0500) FiO2 (%):  [40 %] 40 % (12/29 0447) Weight:  [79.8 kg] 79.8 kg (12/29 0500)  General - Well nourished, well developed, intubated not responsive  Ophthalmologic - fundi not visualized due to noncooperation.  Cardiovascular - Regular rate and rhythm.  Neuro - intubated not on sedation, not responsive to voice or pain.  Eyes closed, with eyes forced open, PERRL, doll's eyes present.  Not blinking to visual threat bilaterally.  Not tracking objects.  Absent left corneal reflex, however right corneal reflex present.  Gag reflex intact.  Facial symmetry not able to test due to ET tube.  Tongue midline involved.  On pain stimulation, no moving of all 4 extremities.  DTR diminished, no Babinski. Sensation, coordination and gait not tested.   ASSESSMENT/PLAN [name] is a 67 y.o. male with history of dementia, CVA, DM2, recent GI bleed, respiratory failure d/t pneumonia with previous trach  presenting with AMS. He did not receive IV t-PA due to late presentation.  Strokes: Large bilateral PCA and MCA infarcts with small hemorrhagic conversion - embolic - source unclear  Resultant unresponsive  CT head x 2 - Multiple bilateral areas of recent infarction with small right MCA hemorrhagic conversion - midline shift to the left of 4 mm  B LE Dopplers - limited study but negative for DVT   2D Echo - EF 55 - 60%. No cardiac source of emboli identified.   EEG - nonspecific cerebral dysfunction (encephalopathy).  LDL - 23  HgbA1c 6.6  VTE prophylaxis - SCDs  Diet - NPO  aspirin 325 mg daily  prior to admission, now on aspirin 81 mg daily  Disposition:  given multiple severe medical condition and likely poor neurological outcome, discussed with sister and she is leaning toward comfort care.   Multiple significant medical issues  AKI on CKD - currently on intermittent HD, nephrology on board  Hypotension, was on Levophed and neo, now off  Sepsis with Klebsiella pneumoniae pneumonia with leukocytosis WBC 13.9-19.5-24.4 and fever with Tmax 101.1, treated with meropenem  GI bleeding, off heparin for dialysis, on PPI  Severe anemia status post transfusion hemoglobin 6.5-10.2->9.3  History of dementia, on Aricept  Respiratory failure, intubated, metabolic acidosis, unresponsive, not able to be extubated at this time.  Given multiple significant medical issues with severe bilateral strokes, patient likelihood of functional recovery is unlikely.  Sister is leaning towards comfort care - pending palliative care talking with sister  Hypotension  Stable now off pressor . Long-term BP goal normotensive  Hyperlipidemia  Lipid lowering medication PTA:  Lipitor 80 mg daily  LDL 23, goal < 70  Current lipid lowering medication: lipitor on hold now  Diabetes  HgbA1c 6.6, goal < 7.0  Controlled  On Levemir 25 units twice daily  SSI  CBG monitoring  Other Stroke Risk Factors  Advanced age  Former cigarette smoker - quit  Hx stroke/TIA  Other Active Problems  Thrombocytopenia, platelet 157  Hypokalemia, resolved  Hospital day # 8  Marvel PlanJindong Jahzara Slattery, MD PhD Stroke Neurology 04/12/2018 3:03 PM  To contact Stroke Continuity provider, please refer to [email or web address]. After hours, contact General Neurology

## 2018-04-12 NOTE — Progress Notes (Signed)
eLink Physician-Brief Progress Note Patient Name: Raelene Bottddie Mask DOB: 26-Sep-1950 MRN: [id number]   Date of Service  04/12/2018  HPI/Events of Note  K+ = 3.6 and Creatinine = 1.75 --> 2.28.  eICU Interventions  Will not replace K+ at this time.      Intervention Category Major Interventions: Electrolyte abnormality - evaluation and management  Ammy Lienhard Eugene 04/12/2018, 6:27 AM

## 2018-04-13 ENCOUNTER — Inpatient Hospital Stay (HOSPITAL_COMMUNITY): Payer: Medicare (Managed Care)

## 2018-04-13 DIAGNOSIS — N179 Acute kidney failure, unspecified: Secondary | ICD-10-CM

## 2018-04-13 DIAGNOSIS — Z7189 Other specified counseling: Secondary | ICD-10-CM

## 2018-04-13 DIAGNOSIS — Z515 Encounter for palliative care: Secondary | ICD-10-CM

## 2018-04-13 LAB — POCT I-STAT 3, ART BLOOD GAS (G3+)
ACID-BASE EXCESS: 2 mmol/L (ref 0.0–2.0)
Bicarbonate: 26.1 mmol/L (ref 20.0–28.0)
O2 Saturation: 97 %
TCO2: 27 mmol/L (ref 22–32)
pCO2 arterial: 35.8 mmHg (ref 32.0–48.0)
pH, Arterial: 7.471 — ABNORMAL HIGH (ref 7.350–7.450)
pO2, Arterial: 85 mmHg (ref 83.0–108.0)

## 2018-04-13 LAB — GLUCOSE, CAPILLARY
GLUCOSE-CAPILLARY: 169 mg/dL — AB (ref 70–99)
Glucose-Capillary: 177 mg/dL — ABNORMAL HIGH (ref 70–99)
Glucose-Capillary: 190 mg/dL — ABNORMAL HIGH (ref 70–99)
Glucose-Capillary: 208 mg/dL — ABNORMAL HIGH (ref 70–99)

## 2018-04-13 LAB — RENAL FUNCTION PANEL
Albumin: 1.4 g/dL — ABNORMAL LOW (ref 3.5–5.0)
Anion gap: 11 (ref 5–15)
BUN: 82 mg/dL — ABNORMAL HIGH (ref 8–23)
CO2: 25 mmol/L (ref 22–32)
Calcium: 7.2 mg/dL — ABNORMAL LOW (ref 8.9–10.3)
Chloride: 105 mmol/L (ref 98–111)
Creatinine, Ser: 2.3 mg/dL — ABNORMAL HIGH (ref 0.61–1.24)
GFR calc Af Amer: 33 mL/min — ABNORMAL LOW (ref >60)
GFR calc non Af Amer: 28 mL/min — ABNORMAL LOW (ref >60)
GLUCOSE: 193 mg/dL — AB (ref 70–99)
POTASSIUM: 3.5 mmol/L (ref 3.5–5.1)
Phosphorus: 4.2 mg/dL (ref 2.5–4.6)
Sodium: 141 mmol/L (ref 135–145)

## 2018-04-13 LAB — CBC
HCT: 29.1 % — ABNORMAL LOW (ref 39.0–52.0)
HEMOGLOBIN: 9.6 g/dL — AB (ref 13.0–17.0)
MCH: 30.9 pg (ref 26.0–34.0)
MCHC: 33 g/dL (ref 30.0–36.0)
MCV: 93.6 fL (ref 80.0–100.0)
Platelets: 217 10*3/uL (ref 150–400)
RBC: 3.11 MIL/uL — ABNORMAL LOW (ref 4.22–5.81)
RDW: 15.7 % — ABNORMAL HIGH (ref 11.5–15.5)
WBC: 28.1 10*3/uL — ABNORMAL HIGH (ref 4.0–10.5)
nRBC: 0 % (ref 0.0–0.2)

## 2018-04-13 LAB — HEPATITIS B E ANTIBODY: Hep B E Ab: NEGATIVE

## 2018-04-13 MED ORDER — FENTANYL CITRATE (PF) 100 MCG/2ML IJ SOLN
INTRAMUSCULAR | Status: AC
  Filled 2018-04-13: qty 2

## 2018-04-13 MED ORDER — FENTANYL CITRATE (PF) 100 MCG/2ML IJ SOLN
25.0000 ug | INTRAMUSCULAR | Status: DC | PRN
  Administered 2018-04-13 (×2): 25 ug via INTRAVENOUS
  Filled 2018-04-13: qty 2

## 2018-04-13 MED ORDER — ACETAMINOPHEN 325 MG PO TABS: 650.0000 mg | ORAL_TABLET | Freq: Four times a day (QID) | ORAL | Status: DC | PRN

## 2018-04-13 MED ORDER — GLYCOPYRROLATE 1 MG PO TABS: 1.0000 mg | ORAL_TABLET | ORAL | Status: DC | PRN

## 2018-04-13 MED ORDER — FENTANYL CITRATE (PF) 100 MCG/2ML IJ SOLN
50.0000 ug | Freq: Once | INTRAMUSCULAR | Status: AC
  Administered 2018-04-13: 50 ug via INTRAVENOUS
  Filled 2018-04-13: qty 2

## 2018-04-13 MED ORDER — ACETAMINOPHEN 650 MG RE SUPP: 650.0000 mg | Freq: Four times a day (QID) | RECTAL | Status: DC | PRN

## 2018-04-13 MED ORDER — DEXTROSE 5 % IV SOLN: INTRAVENOUS | Status: DC

## 2018-04-13 MED ORDER — GLYCOPYRROLATE 0.2 MG/ML IJ SOLN: 0.2000 mg | INTRAMUSCULAR | Status: DC | PRN

## 2018-04-13 MED ORDER — GLYCOPYRROLATE 0.2 MG/ML IJ SOLN
0.2000 mg | INTRAMUSCULAR | Status: DC | PRN
  Administered 2018-04-13: 0.2 mg via INTRAVENOUS
  Filled 2018-04-13: qty 1

## 2018-04-13 MED ORDER — MORPHINE SULFATE (PF) 2 MG/ML IV SOLN: 2.0000 mg | INTRAVENOUS | Status: DC | PRN

## 2018-04-13 MED ORDER — POLYVINYL ALCOHOL 1.4 % OP SOLN
1.0000 [drp] | Freq: Four times a day (QID) | OPHTHALMIC | Status: DC | PRN
  Filled 2018-04-13: qty 15

## 2018-04-13 MED ORDER — MORPHINE 100MG IN NS 100ML (1MG/ML) PREMIX INFUSION
0.0000 mg/h | INTRAVENOUS | Status: DC
  Administered 2018-04-13: 5 mg/h via INTRAVENOUS
  Administered 2018-04-13: 20 mg/h via INTRAVENOUS
  Filled 2018-04-13 (×2): qty 100

## 2018-04-13 MED ORDER — MORPHINE BOLUS VIA INFUSION
5.0000 mg | INTRAVENOUS | Status: DC | PRN
  Administered 2018-04-13 (×6): 5 mg via INTRAVENOUS
  Filled 2018-04-13: qty 5

## 2018-04-13 MED ORDER — DIPHENHYDRAMINE HCL 50 MG/ML IJ SOLN
25.0000 mg | INTRAMUSCULAR | Status: DC | PRN
  Filled 2018-04-13: qty 0.5

## 2018-04-15 NOTE — Progress Notes (Signed)
80 ml of Morphine wasted in stericycle with RN Toni Amendourtney.

## 2018-04-15 NOTE — Progress Notes (Signed)
NAME:  Zyeir Dymek, MRN:  [id number], DOB:  July 27, 1950, LOS: 9 ADMISSION DATE:  03/26/2018, CHIEF COMPLAINT:  Altered mental status  History of present illness   68 year old man, former smoker, with dementia, CVA, T2DM, history of respiratory failure 2/2 pneumonia with previous trach (decannulated now), GERD presenting with altered mental status. Lives at [facility]. Sister noted that his mental status had changed for the last week - less conversant and interactive.  He had acute worsening of mental status on day of admission with episode of emesis. At baseline he is conversant and oriented x 2 (gets confused about month).  Found to be in multiorgan failure, septic shock..  Intubated at [facility] regional and transferred to [facility] for further management.    Past Medical History  Former smoker, with dementia, history CVA, T2DM, history of respiratory failure 2/2 pneumonia with previous trach (decannulated now), GERD, osteoporosis.  Significant Hospital Events   12/21 Admission, vas cath placement and CRRT initiated. Heparin for elevated troponin 12/22 Developed GI bleed. Heparin stopped 12/23 Off pressors 12/25 Failing weaning trials 12/26 CVVH stopped 12/27 subacute infarcts, neuro consulted 12/28 palliative care consulted  Consults:  Nephrology 12/21 Cardiology 12/22. Signed off GI 12/22. Signed off Neurology 12/27  Procedures:  Rt IJ vas cath placement 12/21 >> 12/30 Rt radial A line 12/20 > 12/21 Rt fem A line 12/21 > 12/26 Lt fem CVL 12/20 >   Significant Diagnostic Tests:  CT head w/o contrast on 12/20: No CT evidence for acute intracranial abnormality. Atrophy and mild small vessel ischemic changes of the white matter. Echocardiogram 12/21-mild LVH, EF 55-60%, no regional wall motion abnormality, grade 1 diastolic dysfunction. LE duplex 12/23 > no DVT; though CFV not visualized due to bandages and poor visualization of popliteals. CT head 12/27: multiple b/l  subacute infarcts, 4 mm Rt to Lt midline shift  Micro Data:  BCx 12/21 No growth Resp Cx 12/21 Kleb (ESBL)  Antimicrobials:  Vanc 12/21 > 12/25 Cefepime 12/21 > 12/24 Meropenem 12/24 >  Interim history/subjective:  Remains on full vent support.  Had tachypnea 12/29 so changed from [facility] to [facility] which did help; however 12/30, tachypneic again in 30's and 40s.  Objective   Blood pressure (!) 105/49, pulse 83, temperature (!) 100.4 F (38 C), resp. rate (!) 36, height '5\' 4"'  (1.626 m), weight 80.5 kg, SpO2 96 %.    Vent Mode: PCV FiO2 (%):  [50 %-80 %] 80 % Set Rate:  [22 bmp] 22 bmp PEEP:  [5 cmH20] 5 cmH20 Plateau Pressure:  [17 cmH20-24 cmH20] 24 cmH20   Intake/Output Summary (Last 24 hours) at 04/27/18 1034 Last data filed at 04-27-2018 1000 Gross per 24 hour  Intake 1910 ml  Output 2236 ml  Net -326 ml   Filed Weights   04/11/18 0429 04/12/18 0500 2018-04-27 0500  Weight: 80.3 kg 79.8 kg 80.5 kg   Examination:  General: Elderly male, chronically ill appearing, resting in bed, in NAD. Neuro: Non-responsive despite no sedation. HEENT: [place]/AT. Sclerae anicteric.  ETT in place. Cardiovascular: RRR, no M/R/G.  Lungs: Respirations even and unlabored.  CTA bilaterally, No W/R/R. Abdomen: BS x 4, soft, NT/ND.  Musculoskeletal: No gross deformities, bilateral hands with 1+ edema. Skin: Intact, warm, no rashes.   Assessment & Plan:   Acute respiratory failure from ESBL Klebsiella pneumonia with septic shock. Plan - continue pressure control - 1 time dose 48mg fentanyl now for tachypnea - family on way to hospital this AM, will discuss  goals of care with them - not candidate for extubation due to mental status - f/u CXR intermittently  Septic shock with multiorgan failure from pneumonia. Plan - day 7 of meropenem  NSTEMI. Plan - continue ASA, lipitor  GI bleed. Plan - bid protonix  Thrombocytopenia - resolved. Plan - f/u CBC intermittently  Acute renal  failure from ATN. Plan - renal following  DM with hyperglycemia. Plan  - SSI  CVA. Hx of dementia. Plan - no signs for neuro recovery so far - palliative care consulted - neurology following   Best practice:  Diet: Tube feeds DVT prophylaxis: SCDs GI prophylaxis: protonix Mobility: Bed rest Code Status: DNR  Family Communication: met with pt's sister 12/29.  Discussed concern for lack of neuro recovery.  She is agreeable to have further discussions with palliative care and would want more input from neurology before making further decisions about goals of care. Called family 12/30, they are on the way to the hospital now, will discuss goals of care with them once they arrive.  I have updated PMT team who will be available at 1130 for family meeting.  CC time: 30 min.   Montey Hora, [place] [facility] Pager: [phone]  or [phone] 04/21/2018, 10:35 AM

## 2018-04-15 NOTE — Progress Notes (Signed)
PCCM Interval Progress Note  Met with pt's sister Education officer, community) and brother law. Family wanting to move to comfort measures; however, would like to spend some time at pt's bedside.  Will notify RN once they are ready to transition.   Montey Hora, [place] [facility] Pager: [phone]  or [phone] May 12, 2018, 12:15 PM

## 2018-04-15 NOTE — Consult Note (Signed)
Consultation Note Date: 2017-12-08   Patient Name: Craig Mason  DOB: [birth year]  MRN: [id number]  Age / Sex: 68 y.o., male  PCP: Shelbie AmmonsHaque, Imran P, MD Referring Physician: Lupita LeashMcQuaid, Douglas B, MD  Reason for Consultation: Establishing goals of care  HPI/Patient Profile: 68 y.o. male  with past medical history of dementia, [facility], CVA, T2DM, previous respiratory with trach s/p decannulation admitted on 04/03/2018 with worsening mental status. Workup revealed septic shock requiring IV pressors, CRRT, intubation- likely related to pnuemonia.Renal function improved and CRRT held, however, mental status did not. CT scan showed multiple infarctions with midline shift, repeat CT on 12/28 showed worsening midline shift. Palliative medicine consulted for GOC.    Clinical Assessment and Goals of Care: I attended the end of GOC meeting held BuckhornRahul Desai, [place] with patient's sister who states she is [facility].  At that decision had been made to transition patient to comfort measures and discussion regarding timing was being discussed.  Patient has a daughter in [place] and sister was considering waiting for her before making transition to comfort.   Primary Decision Maker HCPOA- patient's sister    SUMMARY OF RECOMMENDATIONS -Agree with recommendation to transition patient to comfort sooner rather than later as patient had indicated this would be his wishes -Recommend alternative means of communication opportunity for daughter to have closure before extubation (video call or phone call) if daughter desires -Please call Palliative Medicine provider if assistance with extubation or comfort care is needed- will shadow chart and see patient after extubation for symptoms and disposition if needed    Code Status/Advance Care Planning:  DNR   Additional Recommendations (Limitations, Scope,  Preferences):  Full Comfort Care  Prognosis:    Hours - Days once transition to full comfort care is made  Discharge Planning: Anticipated Hospital Death  Primary Diagnoses: Present on Admission: . Acute encephalopathy   I have reviewed the medical record, interviewed the patient and family, and examined the patient. The following aspects are pertinent.  History reviewed. No pertinent past medical history. Social History   Socioeconomic History  . Marital status: Not on file    Spouse name: Not on file  . Number of children: Not on file  . Years of education: Not on file  . Highest education level: Not on file  Occupational History  . Not on file  Social Needs  . Financial resource strain: Not on file  . Food insecurity:    Worry: Not on file    Inability: Not on file  . Transportation needs:    Medical: Not on file    Non-medical: Not on file  Tobacco Use  . Smoking status: Former Smoker    Packs/day: 3.00    Years: 40.00    Pack years: 120.00    Types: Cigarettes, E-cigarettes  . Smokeless tobacco: Never Used  Substance and Sexual Activity  . Alcohol use: Not on file  . Drug use: Not on file  . Sexual activity: Not on file  Lifestyle  . Physical activity:    Days per week: Not on file    Minutes per session: Not on file  . Stress: Not on file  Relationships  . Social connections:    Talks on phone: Not on file    Gets together: Not on file    Attends religious service: Not on file    Active member of club or organization: Not on file    Attends meetings of clubs or organizations: Not on file    Relationship status: Not on file  Other Topics Concern  . Not on file  Social History Narrative  . Not on file   History reviewed. No pertinent family history. Scheduled Meds: Continuous Infusions: . dextrose    . morphine     PRN Meds:.acetaminophen **OR** acetaminophen, diphenhydrAMINE, glycopyrrolate **OR** glycopyrrolate **OR** glycopyrrolate,  morphine injection, morphine, polyvinyl alcohol Medications Prior to Admission:  Prior to Admission medications   Medication Sig Start Date End Date Taking? Authorizing Provider  acetaminophen (TYLENOL) 325 MG tablet Take 650 mg by mouth every 6 (six) hours as needed for mild pain or fever.   Yes [provider]  atorvastatin (LIPITOR) 80 MG tablet Take 80 mg by mouth at bedtime.    Yes [provider]  Cholecalciferol (KP VITAMIN D3) 50 MCG (2000 UT) CAPS Take 1 capsule by mouth daily.   Yes [provider]  donepezil (ARICEPT) 10 MG tablet Take 10 mg by mouth at bedtime.    Yes [provider]  folic acid (FOLVITE) 1 MG tablet Take 1 mg by mouth daily.    Yes [provider]  insulin detemir (LEVEMIR) 100 UNIT/ML injection Inject 28 Units into the skin 2 (two) times daily.    Yes [provider]  LORazepam (ATIVAN) 0.5 MG tablet Take 0.5 mg by mouth every evening. At 1700   Yes [provider]  LORazepam (ATIVAN) 0.5 MG tablet Take 0.25 mg by mouth 2 (two) times daily as needed (agitation).   Yes [provider]  metFORMIN (GLUCOPHAGE) 1000 MG tablet Take 1,000 mg by mouth 2 (two) times daily.    Yes [provider]  oxybutynin (DITROPAN) 5 MG tablet Take 10 mg by mouth at bedtime.   Yes [provider]  traMADol (ULTRAM) 50 MG tablet Take 50 mg by mouth every 6 (six) hours as needed for moderate pain.  10/17/17  Yes [provider]  traZODone (DESYREL) 100 MG tablet Take 100 mg by mouth at bedtime.    Yes [provider]  venlafaxine (EFFEXOR) 75 MG tablet Take 75 mg by mouth every morning.    Yes [provider]   No Known Allergies Review of Systems  Physical Exam  Vital Signs: BP 110/68   Pulse 82   Temp (!) 100.6 F (38.1 C)   Resp (!) 28   Ht 5\' 4"  (1.626 m)   Wt 80.5 kg   SpO2 97%   BMI 30.46 kg/m  Pain Scale: CPOT       SpO2: SpO2: 97 % O2 Device:SpO2: 97  % O2 Flow Rate: .   IO: Intake/output summary:   Intake/Output Summary (Last 24 hours) at Nov 04, 2017 1255 Last data filed at Nov 04, 2017 1100 Gross per 24 hour  Intake 1845 ml  Output 2262 ml  Net -417 ml    LBM: Last BM Date: 2017-09-08 Baseline Weight: Weight: 75.1 kg Most recent weight: Weight: 80.5 kg     Palliative Assessment/Data: PPS: 10%  Flowsheet Rows     Most Recent Value  Intake Tab  Referral Department  Critical care  Unit at Time of Referral  ICU  Palliative Care Primary Diagnosis  Other (Comment) [multiorgan failure, septic shock]  Date Notified  04/12/18  Palliative Care Type  New Palliative care  Reason for referral  Clarify Goals of Care  Date of Admission  04/29/18  # of days IP prior to Palliative referral  8  Clinical Assessment  Psychosocial & Spiritual Assessment  Palliative Care Outcomes      Thank you for this consult. Palliative medicine will continue to follow and assist as needed.   Time In: 1200   Time Out: 1245 Time Total: 45 minutes Greater than 50%  of this time was spent counseling and coordinating care related to the above assessment and plan.  Signed by: Ocie Bob, AGNP-C Palliative Medicine    Please contact Palliative Medicine Team phone at [phone] for questions and concerns.  For individual provider: See Loretha Stapler

## 2018-04-15 NOTE — Consult Note (Signed)
WOC Nurse wound consult note Reason for Consult:Devivce related full thickness pressure injury to lips and tongue from ET tube. Has failed weaning trials.  Deep tissue injury to occiput.  Wound type:Pressure injury Pressure Injury POA: No Measurement:Occiput:  2.4 cm x 2 cm maroon discoloration Bilateral boggyheels with Prevalon boots in place Scabbed lesions to lips and tongue from ET tube.  Drainage (amount, consistency, odor) None Periwound:Intact Dressing procedure/placement/frequency:Turn and reposition every two hours.   Prevalon boots to bilateral heels Frequent mouth care  Will not follow at this time.  Please re-consult if needed.  Maple HudsonKaren Mavryk Pino MSN, RN, FNP-BC CWON Wound, Ostomy, Continence Nurse Pager [phone]

## 2018-04-15 NOTE — Progress Notes (Signed)
Nutrition Brief Note  Chart reviewed. Pt now transitioning to comfort care. No further nutrition interventions warranted at this time.  Please re-consult as needed.    Remmi Armenteros MS, RD, LDN, CNSC [phone] Pager  [phone] Weekend/On-Call Pager     

## 2018-04-15 NOTE — Progress Notes (Signed)
Pt. asystole on the cardiac monitor. No palpable pulses felt nor lung sounds auscultated, and no heart sounds heard for one minute. Celene Krasillon White, RN witnessed. MD notified and family notified.

## 2018-04-15 NOTE — Progress Notes (Signed)
PCCM Interval Progress Note  I have had an extensive bedside discussion with Mr. Lillia MountainWilkins family (Sister Eber JonesCarolyn who is HCPOA and brother in [place] designer[place] Billy) regarding the current circumstances, poor prognosis, and likely poor quality of life for him.  We also discussed the patient's prior wishes under circumstances such as this.  The family has decided to offer full comfort care for him. They are aware that the he may be transferred to the palliative care floor for continued comfort care needs. They have been fully updated on the process and expectations and all questions have been answered.  Withdrawal / comfort orders placed.   [place] Guysahul Cailie Bosshart, [place] - C [place] [facility] Pager: [phone]  or [phone] 04/11/2018, 12:36 PM

## 2018-04-15 NOTE — Progress Notes (Signed)
[facility] ROUNDING NOTE   Subjective:    Subjective/HPI:   Pt with 1.5 liters UOP over 12/29.  Last received HD on 12/27 with 1 kg UF at that time.  Palliative care is discussing goals of care with family.  Spoke with nursing and she states that the patient's family is discussing timing of palliative extubation however they do not think this would happen until Friday as his daughter is out of town.  Review of systems:  Unable to obtain 2/2 mechanical ventilation    Objective:  Vital signs in last 24 hours:  Temp:  [100.6 F (38.1 C)-101.1 F (38.4 C)] 100.6 F (38.1 C) (12/30 0600) Pulse Rate:  [75-88] 81 (12/30 0600) Resp:  [20-40] 23 (12/30 0600) BP: (72-143)/(42-72) 119/52 (12/30 0600) SpO2:  [88 %-100 %] 92 % (12/30 0600) FiO2 (%):  [40 %-50 %] 50 % (12/30 0428) Weight:  [80.5 kg] 80.5 kg (12/30 0500)  Weight change: 0.7 kg Filed Weights   04/11/18 0429 04/12/18 0500 05-02-18 0500  Weight: 80.3 kg 79.8 kg 80.5 kg    Intake/Output: I/O last 3 completed shifts: In: 2959.4 [I.V.:30; CX/KG:8185; IV Piggyback:344.4] Out: 2570 [Urine:1670; Stool:900]   Intake/Output this shift:  Total I/O In: 855 [I.V.:20; NG/GT:835] Out: 1000 [Urine:700; Stool:300]  General adult male in bed critically ill  HEENT normocephalic atraumatic  Neck ET tube in place  Lungs coarse mechanical breath sounds Heart  RRR; no rubs Abdomen soft nondistended  Extremities no lower extremity edema; 1+ edema upper extremities Psych unable to obtain 2/2 mechanical vent; no agitation with exam   Right IJ non-tunneled line for HD in place; dressed   Basic Metabolic Panel: Recent Labs  Lab 04/08/18 1629 04/09/18 0520  04/10/18 0509 04/10/18 1600 04/11/18 0416 04/12/18 0455 02-May-2018 0433  NA 136 137   < > 139 143 138 141 141  K 3.9 3.2*   < > 3.7 3.3* 3.2* 3.6 3.5  CL 105 103   < > 105 106 101 104 105  CO2 20* 26   < > '26 31 26 26 25  ' GLUCOSE 306* 239*   < > 271* 147* 259*  170* 193*  BUN 31* 28*   < > 61* 28* 46* 67* 82*  CREATININE 1.52* 1.29*   < > 2.18* 1.17 1.75* 2.28* 2.30*  CALCIUM 6.8* 7.1*   < > 7.0* 7.8* 7.0* 7.1* 7.2*  MG 2.3 2.4  --  2.1  --  1.7 1.8  --   PHOS 1.8*  1.7* 1.2*  1.1*   < > 1.4* 1.4* 3.9 3.3 4.2   < > = values in this interval not displayed.    Liver Function Tests: Recent Labs  Lab 04/10/18 0509 04/10/18 1600 04/11/18 0416 04/12/18 0455 05/02/18 0433  AST 44*  --   --   --   --   ALT 43  --   --   --   --   ALKPHOS 81  --   --   --   --   BILITOT 0.7  --   --   --   --   PROT 4.3*  --   --   --   --   ALBUMIN 1.4* 1.7* 1.5* 1.4* 1.4*    CBC: Recent Labs  Lab 04/10/18 0509 04/10/18 1625 04/11/18 0416 04/12/18 0455 May 02, 2018 0433  WBC 13.9* 19.5* 18.2* 24.4* 28.1*  NEUTROABS  --  16.7*  --   --   --   HGB  6.5* 10.2* 9.4* 9.3* 9.6*  HCT 19.8* 30.5* 27.8* 28.3* 29.1*  MCV 97.5 91.9 92.4 92.8 93.6  PLT 91* 97* 100* 157 217      Imaging: Ct Head Wo Contrast  Result Date: 04/11/2018 CLINICAL DATA:  Stroke follow-up EXAM: CT HEAD WITHOUT CONTRAST TECHNIQUE: Contiguous axial images were obtained from the base of the skull through the vertex without intravenous contrast. COMPARISON:  04/10/2018 FINDINGS: Brain: Extensive hypoattenuation throughout both hemispheres, right greater than left, is unchanged. Areas of petechial hemorrhage within the right MCA territory are unchanged, the largest measuring 10 mm. Leftward midline shift measures 6 mm, previously 4 mm. Areas of infarction within both cerebellar hemispheres are unchanged. Basal cisterns remain patent. No hydrocephalus. Vascular: There is atherosclerotic calcification of the internal carotid and vertebral arteries at the skull base. Skull: Normal. Negative for fracture or focal lesion. Sinuses/Orbits: No acute finding. Other: None. IMPRESSION: 1. Unchanged appearance of large scale, multifocal, bilateral infarcts, right worse than left. 2. Unchanged areas of  petechial hemorrhage within the right MCA territory. 3. Slight increase in leftward midline shift, now measuring 6 mm, previously 4 mm. Electronically Signed   By: Ulyses Jarred M.D.   On: 04/11/2018 06:30   Dg Chest Port 1 View  Result Date: 04/12/2018 CLINICAL DATA:  Respiratory failure EXAM: PORTABLE CHEST 1 VIEW COMPARISON:  Chest radiograph 04/10/2018 FINDINGS: ET tube terminates in the mid trachea. Enteric tube courses inferior to the diaphragm. Monitoring leads overlie the patient. Right IJ sheath projects over the superior vena cava. Stable cardiac and mediastinal contours. Aortic atherosclerosis. Low lung volumes. Similar bilateral mid lower lung heterogeneous opacities. No pleural effusion or pneumothorax. IMPRESSION: Stable support apparatus. Low lung volumes with bibasilar opacities which may represent atelectasis or infection. Electronically Signed   By: Lovey Newcomer M.D.   On: 04/12/2018 08:06     Medications:   . sodium chloride Stopped (04/10/18 1041)  . feeding supplement (VITAL HIGH PROTEIN) 1,000 mL (05/09/2018 0538)  . meropenem (MERREM) IV Stopped (04/12/18 1008)   . aspirin  81 mg Per Tube Daily  . B-complex with vitamin C  1 tablet Oral Daily  . chlorhexidine gluconate (MEDLINE KIT)  15 mL Mouth Rinse BID  . Chlorhexidine Gluconate Cloth  6 each Topical Daily  . donepezil  10 mg Oral QHS  . feeding supplement (PRO-STAT SUGAR FREE 64)  30 mL Per Tube Daily  . insulin aspart  0-15 Units Subcutaneous Q4H  . insulin detemir  25 Units Subcutaneous BID  . mouth rinse  15 mL Mouth Rinse 10 times per day  . pantoprazole sodium  40 mg Per Tube BID  . sodium chloride flush  10-40 mL Intracatheter Q12H   acetaminophen (TYLENOL) oral liquid 160 mg/5 mL, albuterol, bisacodyl, fentaNYL (SUBLIMAZE) injection, midazolam, sodium chloride flush  Assessment/ Plan:   Acute kidney injury presumed (no BL available) secondary to ischemic ATN/severe hypotension requiring pressors.   Initially on CRRT then transitioned to [facility].  Last dialyzed on 12/27 to facilitate blood product administration and optimize fluid status. - non-tunneled dialysis catheter may be removed - spoke with nursing - Continue supportive measures  - no acute indication for dialysis and note plans for likely terminal extubation this week   HTN - acceptable  Hypokalemia.  Resolved.  Note hx hyperkalemia resolved with CRRT/HD  Metabolic acidosis - resolved after RRT  Acute Anemia - overall improved s/p transfusion PRBC's  Hypophos - Secondary in part to CRRT, now off.  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   History of dementia  Diastolic CHF per last echo   PNA - Tracheal aspirate 04/05/2018 moderate Klebsiella pneumonia.  On meropenem    LOS: 9 Craig Mason May 10, 2018  6:12 AM

## 2018-04-15 NOTE — Procedures (Signed)
Extubation Procedure Note  Patient Details:   Name: Craig Mason DOB: 1950-07-31 MRN: [id number]   Airway Documentation:    Vent end date: 03/18/2018 Vent end time: 1413   Evaluation  O2 sats: currently acceptable Complications: No apparent complications Patient did tolerate procedure well. Bilateral Breath Sounds: Diminished, Rhonchi   No   Pt extubated to RA per withdrawal of life protocol.   Trilby LeaverMolly E Thorsten Climer 03/20/2018, 2:17 PM

## 2018-04-15 NOTE — Progress Notes (Signed)
eLink Physician-Brief Progress Note Patient Name: Craig Mason DOB: 01/23/1951 MRN: [id number]   Date of Service  03/15/2018  HPI/Events of Note  Notified of tachypnea despite prn Versed. Patient with bilateral cerebral infarcts and edema with midline shift. Reportedly for terminal extubation in am, awaiting family members.  eICU Interventions  Ordered Fentanyl 25 q 2 prn     Intervention Category Intermediate Interventions: Respiratory distress - evaluation and management  Rosalie Gumsmily T Alita Waldren 03/20/2018, 4:09 AM

## 2018-04-15 NOTE — Plan of Care (Signed)
Note reviewed and learned from RN and palliative care NP that pt sister, the POA, has decided for comfort care which is appropriate and reasonable from neuro standpoint given the poor neuro prognosis. We will sign off at this time, please call with further questions. Thank you for the consult.   Marvel PlanJindong Kathlyne Loud, MD PhD Stroke Neurology February 22, 2018 12:34 PM

## 2018-04-15 DEATH — deceased

## 2018-05-16 NOTE — Death Summary Note (Signed)
DEATH SUMMARY   Patient Details  Name: Craig Mason MRN: [id number] DOB: 07-16-1950  Admission/Discharge Information   Admit Date:  04/21/2018  Date of Death: Date of Death: 04-30-18  Time of Death: Time of Death: Sep 07, 1835  Length of Stay: 09-08-22  Referring Physician: Shelbie Ammons, MD   Reason(s) for Hospitalization  Dyspnea, confusion  Diagnoses  Preliminary cause of death:   Community Acquired Pneumonia Secondary Diagnoses (including complications and co-morbidities):  Active Problems:   Acute encephalopathy   Pressure injury of skin   Acute respiratory failure (HCC)   Malnutrition of moderate degree   Anemia due to GI blood loss   Gastrointestinal hemorrhage   Cerebral embolism with cerebral infarction   Advanced care planning/counseling discussion   Palliative care by specialist   AKI (acute kidney injury) [facility]) CVA  Brief Hospital Course (including significant findings, care, treatment, and services provided and events leading to death)  Craig Mason is a 68 y.o. year old male who 68 year old man, former smoker, with dementia, CVA, T2DM, history of respiratory failure 2/2 pneumonia with previous trach (decannulated now), GERD presenting with altered mental status. Lives at [facility]. Sister noted that his mental status had changed for the last week - less conversant and interactive.  He had acute worsening of mental status on day of admission with episode of emesis. At baseline he is conversant and oriented x 2 (gets confused about month).  Found to be in multiorgan failure, septic shock..  Intubated at [facility] and transferred to [facility] for further management.    He was treated with mechanical ventilation, full vent support.  He developed renal failure and required dialysis.  He was noted to have sub acute infarcts on head imaging.  Despite these aggressive measures his condition did not improve.  We had goals of care conversations with this family and they  were in favor of withdrawal of care.    Pertinent Labs and Studies  Significant Diagnostic Studies Ct Head Wo Contrast  Result Date: 04/11/2018 CLINICAL DATA:  Stroke follow-up EXAM: CT HEAD WITHOUT CONTRAST TECHNIQUE: Contiguous axial images were obtained from the base of the skull through the vertex without intravenous contrast. COMPARISON:  04/10/2018 FINDINGS: Brain: Extensive hypoattenuation throughout both hemispheres, right greater than left, is unchanged. Areas of petechial hemorrhage within the right MCA territory are unchanged, the largest measuring 10 mm. Leftward midline shift measures 6 mm, previously 4 mm. Areas of infarction within both cerebellar hemispheres are unchanged. Basal cisterns remain patent. No hydrocephalus. Vascular: There is atherosclerotic calcification of the internal carotid and vertebral arteries at the skull base. Skull: Normal. Negative for fracture or focal lesion. Sinuses/Orbits: No acute finding. Other: None. IMPRESSION: 1. Unchanged appearance of large scale, multifocal, bilateral infarcts, right worse than left. 2. Unchanged areas of petechial hemorrhage within the right MCA territory. 3. Slight increase in leftward midline shift, now measuring 6 mm, previously 4 mm. Electronically Signed   By: Deatra Robinson M.D.   On: 04/11/2018 06:30   Ct Head Wo Contrast  Result Date: 04/10/2018 CLINICAL DATA:  Dementia.  Altered mental status.  Stroke. EXAM: CT HEAD WITHOUT CONTRAST TECHNIQUE: Contiguous axial images were obtained from the base of the skull through the vertex without intravenous contrast. COMPARISON:  None. FINDINGS: Brain: There are multiple areas of recent infarction reflected by well-defined hypoattenuation, loss of the gray-white junction and varying degrees of mass effect. There is a large infarction extending from the right basal ganglia throughout the middle and  posterior right frontal lobe, involving a significant portion of the right middle cerebral  artery territory. There are bilateral occipital parietal and cerebellar infarcts and smaller areas left mid to posterior frontal lobe infarction. Mass effect is reflected by sulcal effacement, and partial effacement of lateral ventricles, right greater than left, with midline shift to the left of 4 mm. There are small foci of dense 9 mmhemorrhage within the right frontal lobe infarction, largest measuring 9 mm in greatest dimension. There is a small focal hypoattenuation in the left pons consistent with a lacunar infarct of unclear chronicity. There is CSF attenuation that widens the left sylvian fissure. This may reflect an arachnoid cyst. There are no parenchymal masses. Vascular: No hyperdense vessel or unexpected calcification. Skull: Normal. Negative for fracture or focal lesion. Sinuses/Orbits: Globes and orbits are unremarkable. Sinuses and mastoid air cells are clear. Other: None. IMPRESSION: 1. Multiple bilateral areas of recent infarction, which appears subacute, involving middle cerebral and posterior cerebral artery territories as well as the cerebellar hemispheres. Largest infarct is on the right involving the right frontal and parietal lobes. Associated mass effect is noted as detailed with midline shift to the left of 4 mm. 2. There are small areas petechial type hemorrhage within the infarcted right frontal lobe. No other intracranial hemorrhage. Electronically Signed   By: Amie Portland M.D.   On: 04/10/2018 12:30   US Abdomen Complete  Result Date: 04/06/2018 CLINICAL DATA:  Elevated LFTs, acute kidney injury; on ventilator, history diabetes mellitus EXAM: ABDOMEN ULTRASOUND COMPLETE COMPARISON:  None FINDINGS: Gallbladder: Multiple irregular echogenic foci within gallbladder lumen, without definite shadowing, question polyps versus nonshadowing calculi. Patient was not mobile, on ventilator, limiting assessment of motion. Mild gallbladder wall thickening. Unable to assess for Eulah Pont sign due  to patient condition on ventilator. No pericholecystic fluid. Common bile duct: Diameter: 4 mm diameter Liver: Echogenic parenchyma, likely fatty infiltration though this can be seen with cirrhosis and certain infiltrative disorders. No focal hepatic mass or nodularity. Portal vein patent with normal direction of blood flow towards the liver. IVC: Obscured by bowel gas Pancreas: Obscured by bowel gas Spleen: Not visualized, likely due to limited patient positioning Right Kidney: Length: 11.7 cm. Cortical thinning. Increased cortical echogenicity. No mass or hydronephrosis. Left Kidney: Length: 12.9 cm. Cortical thinning period increased cortical echogenicity. No mass or hydronephrosis. Abdominal aorta: Predominately obscured due to bowel gas. RIGHT pleural effusion noted. Perihepatic ascites noted. Other findings: None. IMPRESSION: Limited exam due to patient condition. Nonshadowing echogenic foci within gallbladder question polyps up to 10 mm diameter versus nonshadowing calculi. Probable fatty infiltration of liver. Medical renal disease changes of both kidneys without gross mass or hydronephrosis. Inadequate visualization of IVC, pancreas, spleen and aorta. Electronically Signed   By: Ulyses Southward M.D.   On: 03/18/2018 15:06   Dg Chest Port 1 View  Result Date: 05-12-18 CLINICAL DATA:  Respiratory failure.  Shortness of breath. EXAM: PORTABLE CHEST 1 VIEW COMPARISON:  04/12/2018. FINDINGS: Endotracheal tube, NG tube, right IJ line stable position. Heart size stable. Bilateral pulmonary atelectasis and pulmonary infiltrates again noted. Similar findings noted on prior exam. Small bilateral pleural effusions again noted. No pneumothorax. IMPRESSION: 1.  Lines and tubes in stable position. 2. Bibasilar pulmonary infiltrates and atelectasis again noted. Small bilateral pleural effusions. Similar findings noted on prior exam. Electronically Signed   By: Maisie Fus  Register   On: May 12, 2018 06:07   Dg Chest Port 1  View  Result Date: 04/12/2018 CLINICAL DATA:  Respiratory  failure EXAM: PORTABLE CHEST 1 VIEW COMPARISON:  Chest radiograph 04/10/2018 FINDINGS: ET tube terminates in the mid trachea. Enteric tube courses inferior to the diaphragm. Monitoring leads overlie the patient. Right IJ sheath projects over the superior vena cava. Stable cardiac and mediastinal contours. Aortic atherosclerosis. Low lung volumes. Similar bilateral mid lower lung heterogeneous opacities. No pleural effusion or pneumothorax. IMPRESSION: Stable support apparatus. Low lung volumes with bibasilar opacities which may represent atelectasis or infection. Electronically Signed   By: Annia Belt M.D.   On: 04/12/2018 08:06   Dg Chest Port 1 View  Result Date: 04/10/2018 CLINICAL DATA:  Acute respiratory failure EXAM: PORTABLE CHEST 1 VIEW COMPARISON:  04/09/2018 FINDINGS: Endotracheal tube terminates 5.5 cm above the carina. Mild bilateral lower lobe opacities, possibly atelectasis, less likely pneumonia. Appearance is mildly improved. No frank interstitial edema. No pleural effusion or pneumothorax. The heart is normal in size. Fracture venous catheter terminates in the lower SVC. Enteric tube courses into the stomach. IMPRESSION: Endotracheal tube terminates 5.5 cm above the carina. Additional support apparatus as above. Mild bilateral lower lobe opacities, possibly atelectasis, less likely pneumonia. Electronically Signed   By: Charline Bills M.D.   On: 04/10/2018 06:22   Dg Chest Port 1 View  Result Date: 04/09/2018 CLINICAL DATA:  Check gastric catheter placement EXAM: PORTABLE CHEST 1 VIEW COMPARISON:  Film from earlier in the same day. FINDINGS: Cardiac shadow is stable. Aortic calcifications are again seen. Right jugular central line and endotracheal tube are again seen and stable. Gastric catheter extends into the stomach. The tip is not evaluated on this image. Persistent bibasilar atelectasis is seen. No pneumothorax is  noted. IMPRESSION: Tubes and lines as described above. Bibasilar atelectasis is noted. Electronically Signed   By: Alcide Clever M.D.   On: 04/09/2018 01:10   Dg Chest Port 1 View  Result Date: 04/08/2018 CLINICAL DATA:  Followup ventilator support EXAM: PORTABLE CHEST 1 VIEW COMPARISON:  04/07/2018 FINDINGS: Endotracheal tube tip is 3 cm above the carina. Nasogastric tube enters the abdomen. Right internal jugular central line tip in the SVC at the azygos level. Bilateral effusions with lower lobe atelectasis and or infiltrate persists. Mild edema pattern persists. IMPRESSION: No change. Lines and tubes well positioned. Persistent effusions with lower lobe atelectasis and/or infiltrate and mild edema. Electronically Signed   By: Paulina Fusi M.D.   On: 04/08/2018 07:03   Dg Chest Port 1 View  Result Date: 04/07/2018 CLINICAL DATA:  Acute respiratory failure. EXAM: PORTABLE CHEST 1 VIEW COMPARISON:  04/06/2018. FINDINGS: Unchanged mild cardiac enlargement. Support tubes and lines are stable. Increasing BILATERAL lower lobe opacities representing either pneumonia or edema. BILATERAL effusions appear increased. No pneumothorax. IMPRESSION: Worsening aeration. Electronically Signed   By: Elsie Stain M.D.   On: 04/07/2018 07:20   Dg Chest Port 1 View  Result Date: 04/06/2018 CLINICAL DATA:  Acute respiratory failure.  Shortness of breath. EXAM: PORTABLE CHEST 1 VIEW COMPARISON:  04/05/2018. FINDINGS: Endotracheal tube, NG tube, right IJ line stable position. Heart size stable. Bibasilar atelectasis/infiltrates. Small left pleural effusion. No pneumothorax. IMPRESSION: 1.  Lines and tubes in stable position. 2.  Bibasilar atelectasis/infiltrates.  Small left pleural effusion. Electronically Signed   By: Maisie Fus  Register   On: 04/06/2018 06:55   Dg Chest Port 1 View  Result Date: 04/05/2018 CLINICAL DATA:  Acute respiratory failure. EXAM: PORTABLE CHEST 1 VIEW COMPARISON:  2018/05/01 FINDINGS:  Endotracheal tube has tip 6.1 cm above the carina. Nasogastric tube has tip  and side-port over the stomach in the left upper quadrant. Right IJ central venous catheter has tip over the SVC and is unchanged. Lungs are adequately inflated with subtle linear bibasilar density likely atelectasis. No effusion. Cardiomediastinal silhouette and remainder of the exam is unchanged. IMPRESSION: Mild bibasilar linear density unchanged likely atelectasis. Tubes and lines as described. Electronically Signed   By: Elberta Fortis M.D.   On: 04/05/2018 07:13   Dg Chest Port 1 View  Result Date: 04/10/2018 CLINICAL DATA:  Central line placement. EXAM: PORTABLE CHEST 1 VIEW COMPARISON:  Earlier this morning at 0100 hours. FINDINGS: 0231 hours. Endotracheal tube terminates 5.2 cm above carina. Nasogastric tube terminates at the body of stomach. Right internal jugular line terminates at the low SVC. Borderline cardiomegaly. Atherosclerosis in the transverse aorta. No pleural effusion or pneumothorax. The right costophrenic angle is minimally excluded. Bibasilar atelectasis is worse on the left. IMPRESSION: 1. Appropriate position right internal jugular line, without pneumothorax. 2. Bibasilar atelectasis. 3.  Aortic Atherosclerosis (ICD10-I70.0). Electronically Signed   By: Jeronimo Greaves M.D.   On: 03/17/2018 02:43   Dg Chest Port 1 View  Result Date: 04/11/2018 CLINICAL DATA:  ET and central line plmt EXAM: PORTABLE CHEST 1 VIEW COMPARISON:  None. FINDINGS: Endotracheal tube terminates 5.1 cm above carina. Nasogastric tube terminates at the body of the stomach. Midline trachea. Normal heart size. Atherosclerosis in the transverse aorta. No pleural effusion or pneumothorax. Mild thickening of the right minor fissure. Subsegmental atelectasis or scarring at the left lung base. IMPRESSION: 1. Appropriate position of endotracheal and nasogastric tubes. 2. No acute cardiopulmonary disease. 3.  Aortic Atherosclerosis (ICD10-I70.0).  Electronically Signed   By: Jeronimo Greaves M.D.   On: 03/31/2018 01:24   Dg Abd Portable 1v  Result Date: 04/09/2018 CLINICAL DATA:  Check gastric catheter placement EXAM: PORTABLE ABDOMEN - 1 VIEW COMPARISON:  None. FINDINGS: Scattered large and small bowel gas is noted. Nasogastric catheter is noted within the stomach. No free air is seen. Left femoral line is noted. Degenerative changes of lumbar spine are seen. Compression deformities are noted at T12 and L1. IMPRESSION: Chronic changes without acute abnormality. Gastric catheter within the stomach. Electronically Signed   By: Alcide Clever M.D.   On: 04/09/2018 01:11   US Liver Doppler  Result Date: 04/10/2018 CLINICAL DATA:  Elevated LFTs EXAM: DUPLEX ULTRASOUND OF LIVER TECHNIQUE: Color and duplex Doppler ultrasound was performed to evaluate the hepatic in-flow and out-flow vessels. COMPARISON:  04/04/2008 FINDINGS: Liver: Mild increased echogenicity. Grossly normal hepatic contour. Small amount of perihepatic ascites. Slight intrahepatic biliary prominence without definite obstruction pattern. No focal lesion, mass or intrahepatic biliary ductal dilatation. Portal Vein Velocities Main:  40 cm/sec Right:  15 cm/sec Left:  25 cm/sec Hepatic Vein Velocities Right:  21 cm/sec Middle:  27 cm/sec Left:  Not visualized IVC: Present and patent with normal respiratory phasicity. Hepatic Artery Velocity:  Not visualized Splenic Vein Velocity:  Not visualized Varices: Not visualized Ascites: Small amount of abdominal ascites mild gallbladder wall thickening measuring 4.4 mm, suspect related to chronic hepatic disease. Small gallbladder polyps noted. Trace perihepatic ascites. Small gallstones. Patent portal vein with normal hepatopetal flow. Right and middle hepatic veins are patent. Left hepatic vein and splenic vein not visualized well because of body habitus and ventilatory support. This limits the exam. No gross veno-occlusive process. IMPRESSION: Limited  exam but patent portal vein with normal directional flow. Other findings as above. Electronically Signed   By: Judie Petit.  Shick  M.D.   On: 04/03/2018 14:32   Vas [place] Lower Extremity Venous (dvt)  Result Date: 04/06/2018  Lower Venous Study Performing Technologist: Levada Schilling RDMS, RVT  Examination Guidelines: A complete evaluation includes B-mode imaging, spectral Doppler, color Doppler, and power Doppler as needed of all accessible portions of each vessel. Bilateral testing is considered an integral part of a complete examination. Limited examinations for reoccurring indications may be performed as noted.  Right Venous Findings: +---------+---------------+---------+-----------+----------+------------------+          CompressibilityPhasicitySpontaneityPropertiesSummary            +---------+---------------+---------+-----------+----------+------------------+ CFV      Full           Yes      Yes                                     +---------+---------------+---------+-----------+----------+------------------+ SFJ      Full                                                            +---------+---------------+---------+-----------+----------+------------------+ FV Prox  Full                                                            +---------+---------------+---------+-----------+----------+------------------+ FV Mid   Full                                                            +---------+---------------+---------+-----------+----------+------------------+ FV DistalFull                                                            +---------+---------------+---------+-----------+----------+------------------+ PFV      Full                                                            +---------+---------------+---------+-----------+----------+------------------+ POP      Full           Yes      Yes                                      +---------+---------------+---------+-----------+----------+------------------+ PTV      Full                                         poor visualization +---------+---------------+---------+-----------+----------+------------------+ PERO  Full                                         poor visualization +---------+---------------+---------+-----------+----------+------------------+ GSV      Full                                                            +---------+---------------+---------+-----------+----------+------------------+ atherosclerotic changes noted  Left Venous Findings: +---------+---------------+---------+-----------+----------+----------------+          CompressibilityPhasicitySpontaneityPropertiesSummary          +---------+---------------+---------+-----------+----------+----------------+ CFV                                                   Not visualized   +---------+---------------+---------+-----------+----------+----------------+ SFJ                                                   Not visualized   +---------+---------------+---------+-----------+----------+----------------+ FV Prox  Full           Yes      Yes                                   +---------+---------------+---------+-----------+----------+----------------+ FV Mid   Full                                                          +---------+---------------+---------+-----------+----------+----------------+ FV DistalFull                                                          +---------+---------------+---------+-----------+----------+----------------+ PFV                                                   Not visualized   +---------+---------------+---------+-----------+----------+----------------+ POP      Full                                         poor compression +---------+---------------+---------+-----------+----------+----------------+ PTV       Full                                                          +---------+---------------+---------+-----------+----------+----------------+  PERO     Full                                                          +---------+---------------+---------+-----------+----------+----------------+ GSV      Full                                                          +---------+---------------+---------+-----------+----------+----------------+ unable to visualize CFV due to bandages, poor visualization of popliteal vein due to poor patient positioning.    Summary: Right: There is no evidence of deep vein thrombosis in the lower extremity. However, portions of this examination were limited- see technologist comments above. No cystic structure found in the popliteal fossa. Left: There is no evidence of deep vein thrombosis in the lower extremity. However, portions of this examination were limited- see technologist comments above. No cystic structure found in the popliteal fossa.  *See table(s) above for measurements and observations. Electronically signed by Gretta Beganodd Early MD on 04/06/2018 at 1:45:17 PM.    Final     Microbiology No results found for this or any previous visit (from the past 240 hour(s)).  Lab Basic Metabolic Panel: Recent Labs  Lab 04/09/18 0520  04/10/18 0509 04/10/18 1600 04/11/18 0416 04/12/18 0455 12/06/2017 0433  NA 137   < > 139 143 138 141 141  K 3.2*   < > 3.7 3.3* 3.2* 3.6 3.5  CL 103   < > 105 106 101 104 105  CO2 26   < > 26 31 26 26 25   GLUCOSE 239*   < > 271* 147* 259* 170* 193*  BUN 28*   < > 61* 28* 46* 67* 82*  CREATININE 1.29*   < > 2.18* 1.17 1.75* 2.28* 2.30*  CALCIUM 7.1*   < > 7.0* 7.8* 7.0* 7.1* 7.2*  MG 2.4  --  2.1  --  1.7 1.8  --   PHOS 1.2*  1.1*   < > 1.4* 1.4* 3.9 3.3 4.2   < > = values in this interval not displayed.   Liver Function Tests: Recent Labs  Lab 04/10/18 0509 04/10/18 1600 04/11/18 0416 04/12/18 0455 12/06/2017 0433  AST  44*  --   --   --   --   ALT 43  --   --   --   --   ALKPHOS 81  --   --   --   --   BILITOT 0.7  --   --   --   --   PROT 4.3*  --   --   --   --   ALBUMIN 1.4* 1.7* 1.5* 1.4* 1.4*   No results for input(s): LIPASE, AMYLASE in the last 168 hours. No results for input(s): AMMONIA in the last 168 hours. CBC: Recent Labs  Lab 04/10/18 0509 04/10/18 1625 04/11/18 0416 04/12/18 0455 12/06/2017 0433  WBC 13.9* 19.5* 18.2* 24.4* 28.1*  NEUTROABS  --  16.7*  --   --   --   HGB 6.5* 10.2* 9.4* 9.3* 9.6*  HCT 19.8* 30.5* 27.8* 28.3* 29.1*  MCV 97.5 91.9 92.4 92.8 93.6  PLT 91*  97* 100* 157 217   Cardiac Enzymes: No results for input(s): CKTOTAL, CKMB, CKMBINDEX, TROPONINI in the last 168 hours. Sepsis Labs: Recent Labs  Lab 04/10/18 1625 04/11/18 0416 04/12/18 0455 May 04, 2018 0433  WBC 19.5* 18.2* 24.4* 28.1*    Procedures/Operations  dialysis   Max Fickle 04/15/2018, 5:39 PM

## 2019-10-16 IMAGING — CT CT HEAD W/O CM
4 series · 16 of 47 positions shown, 18 images · non-contrast
Comparison: None.

CLINICAL DATA: Dementia.  Altered mental status.  Stroke.

EXAM:
CT HEAD WITHOUT CONTRAST
TECHNIQUE: Contiguous axial images were obtained from the base of the skull
through the vertex without intravenous contrast.

[Series 3: head without · axial · non-contrast · 0.42mm/px · z∈[-121,-1]mm · 7 of 33 slices shown, 9 images]
[im 5/33  brain]
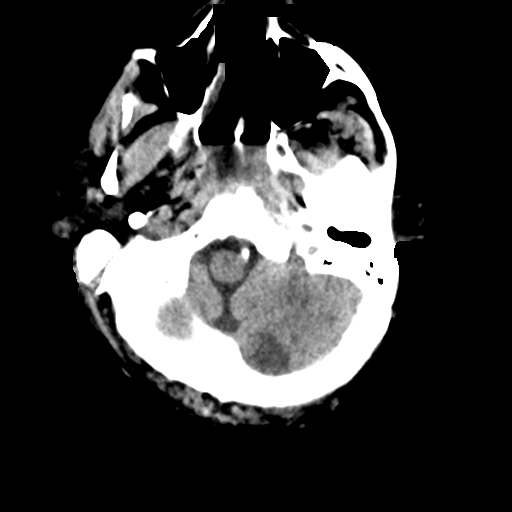
[im 5/33  bone]
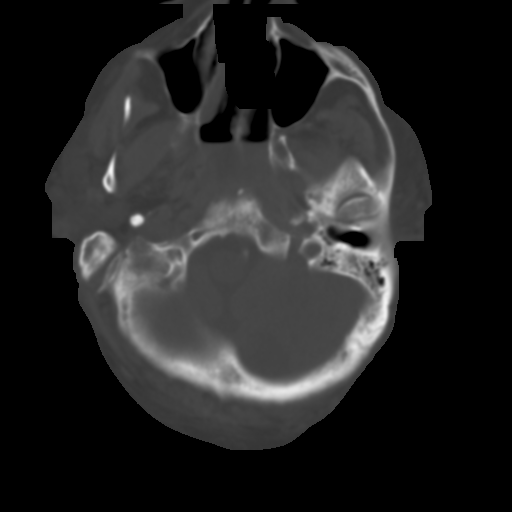
[im 9/33  brain]
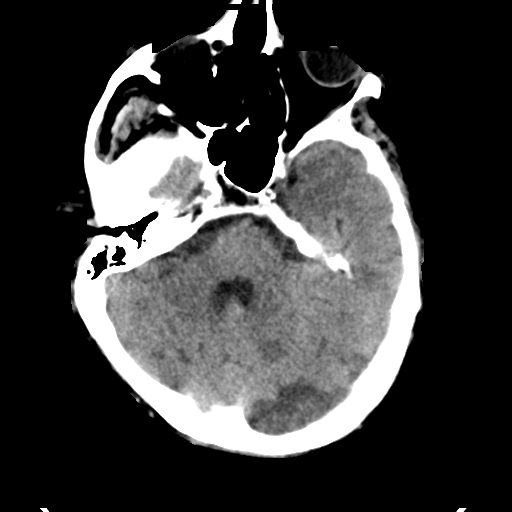
[im 13/33  brain]
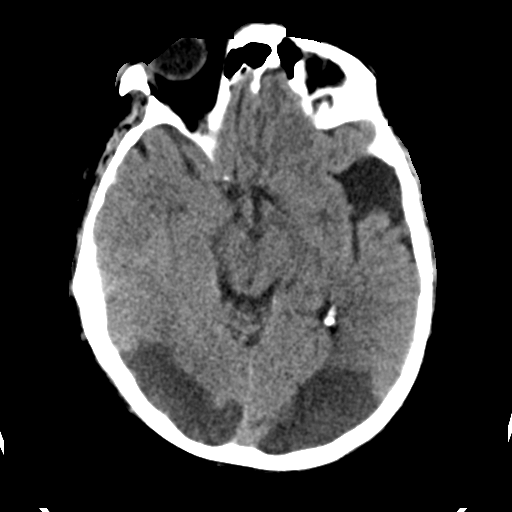
[im 17/33  brain]
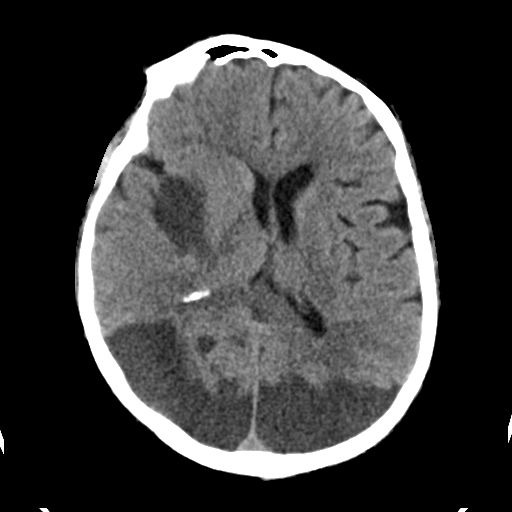
[im 21/33  brain]
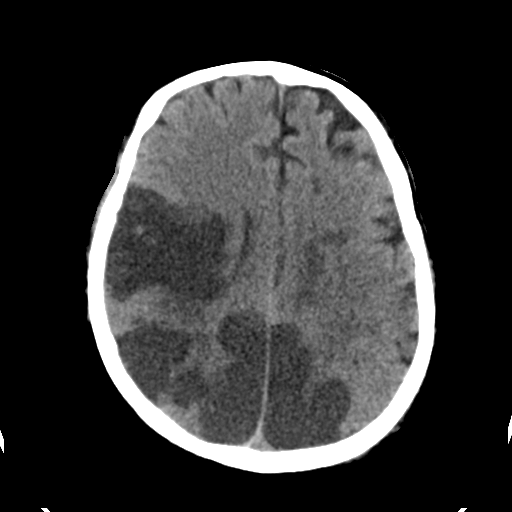
[im 21/33  bone]
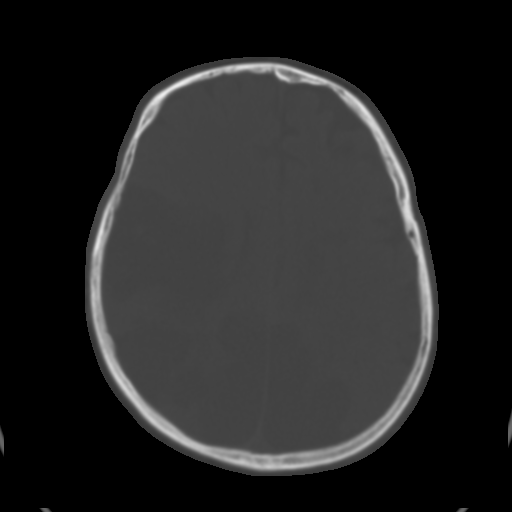
[im 25/33  brain]
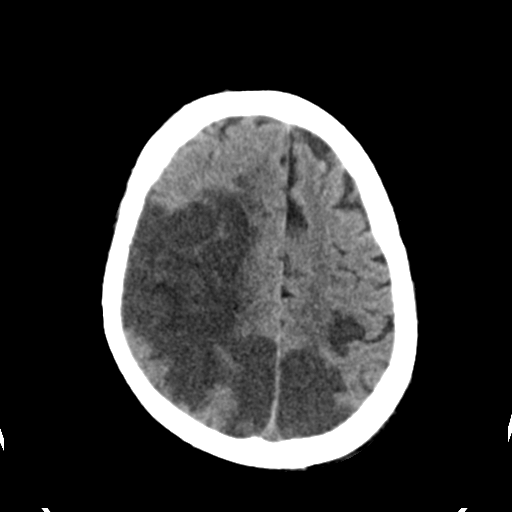
[im 29/33  brain]
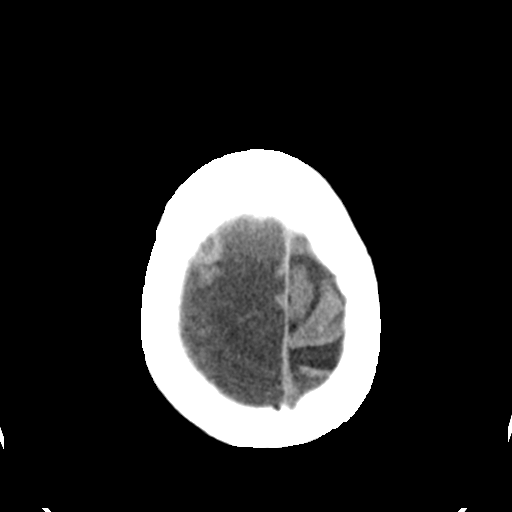

[Series 4: head bone · axial · 0.42mm/px · z∈[-125,-93]mm · 3 of 83 slices shown]
[im 9/83  bone]
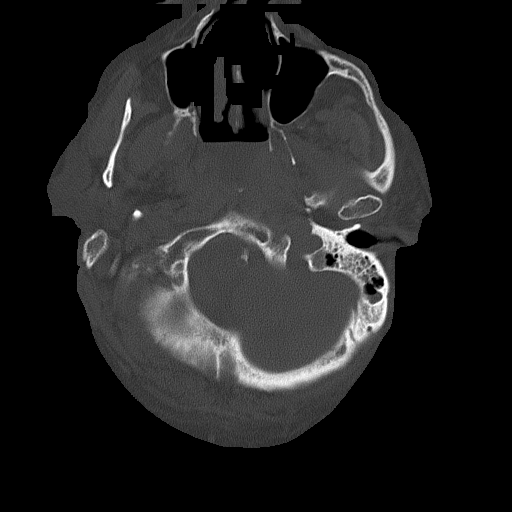
[im 17/83  bone]
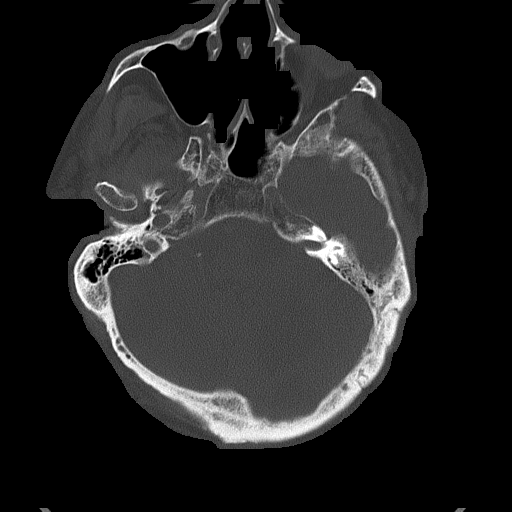
[im 25/83  bone]
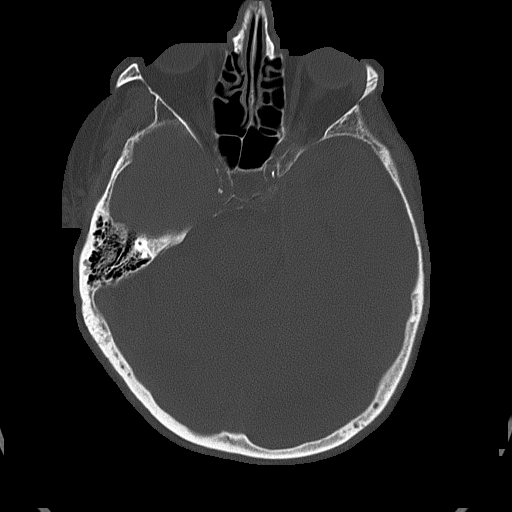

[Series 5: head without cor · coronal · non-contrast · 0.33mm/px · 3 of 72 slices shown]
[im 24/72  brain]
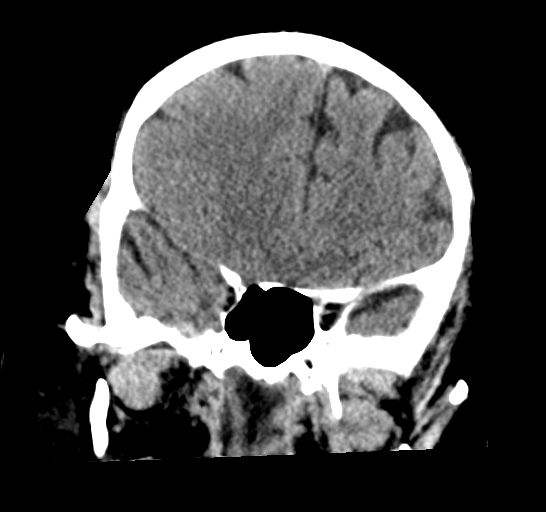
[im 32/72  brain]
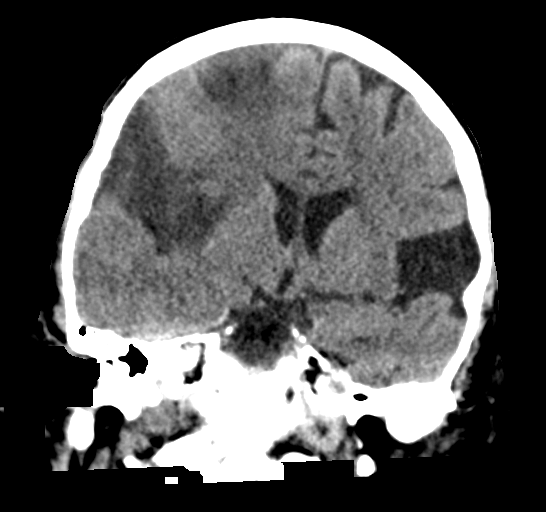
[im 40/72  brain]
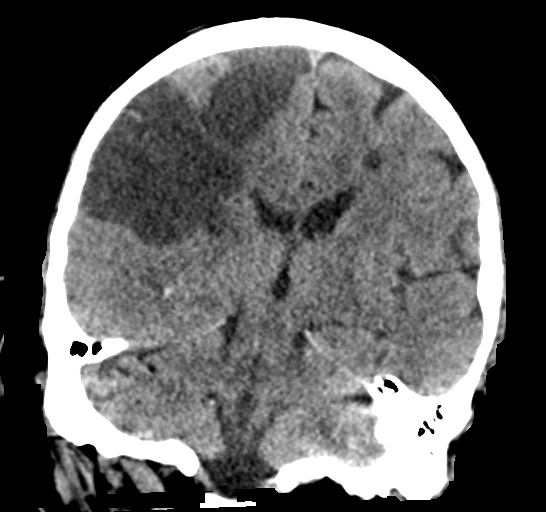

[Series 6: head without sag · sagittal · non-contrast · 0.34mm/px · 3 of 67 slices shown]
[im 23/67  brain]
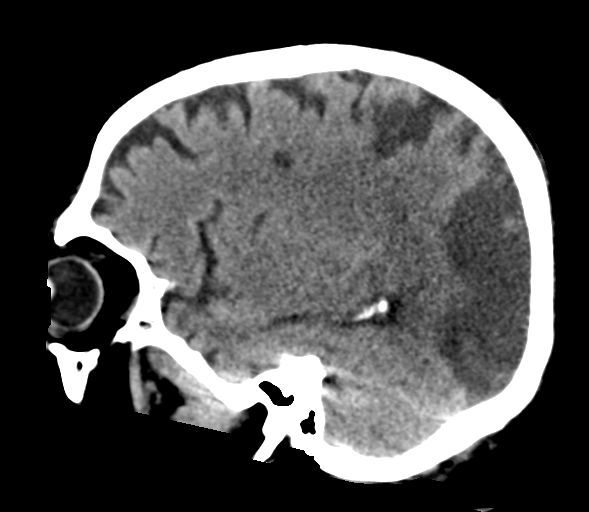
[im 34/67  brain]
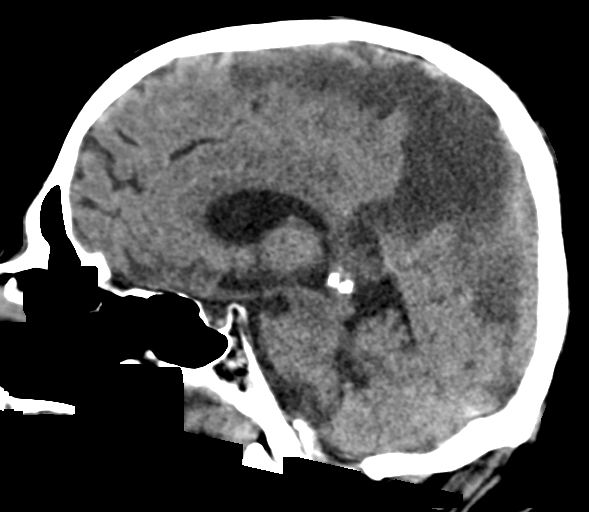
[im 45/67  brain]
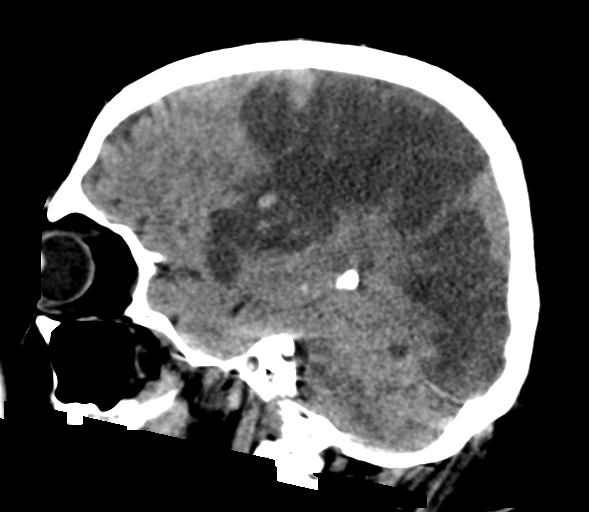

[16 of 47 positions shown; findings below may reference images not displayed]

FINDINGS: Brain: There are multiple areas of recent infarction reflected by
well-defined hypoattenuation, loss of the gray-white junction and
varying degrees of mass effect. There is a large infarction
extending from the right basal ganglia throughout the middle and
posterior right frontal lobe, involving a significant portion of the
right middle cerebral artery territory. There are bilateral
occipital parietal and cerebellar infarcts and smaller areas left
mid to posterior frontal lobe infarction. Mass effect is reflected
by sulcal effacement, and partial effacement of lateral ventricles,
right greater than left, with midline shift to the left of 4 mm.
There are small foci of dense 9 mmhemorrhage within the right
frontal lobe infarction, largest measuring 9 mm in greatest
dimension.

There is a small focal hypoattenuation in the left pons consistent
with a lacunar infarct of unclear chronicity.

There is CSF attenuation that widens the left sylvian fissure. This
may reflect an arachnoid cyst.

There are no parenchymal masses.

Vascular: No hyperdense vessel or unexpected calcification.

Skull: Normal. Negative for fracture or focal lesion.

Sinuses/Orbits: Globes and orbits are unremarkable. Sinuses and
mastoid air cells are clear.

Other: None.
IMPRESSION: 1. Multiple bilateral areas of recent infarction, which appears
subacute, involving middle cerebral and posterior cerebral artery
territories as well as the cerebellar hemispheres. Largest infarct
is on the right involving the right frontal and parietal lobes.
Associated mass effect is noted as detailed with midline shift to
the left of 4 mm.
2. There are small areas petechial type hemorrhage within the
infarcted right frontal lobe. No other intracranial hemorrhage.

## 2019-10-18 IMAGING — DX DG CHEST 1V PORT
1 series · 1 of 1 positions shown · non-contrast
Comparison: Chest radiograph 04/10/2018

CLINICAL DATA: Respiratory failure

EXAM:
PORTABLE CHEST 1 VIEW

[chest ap]
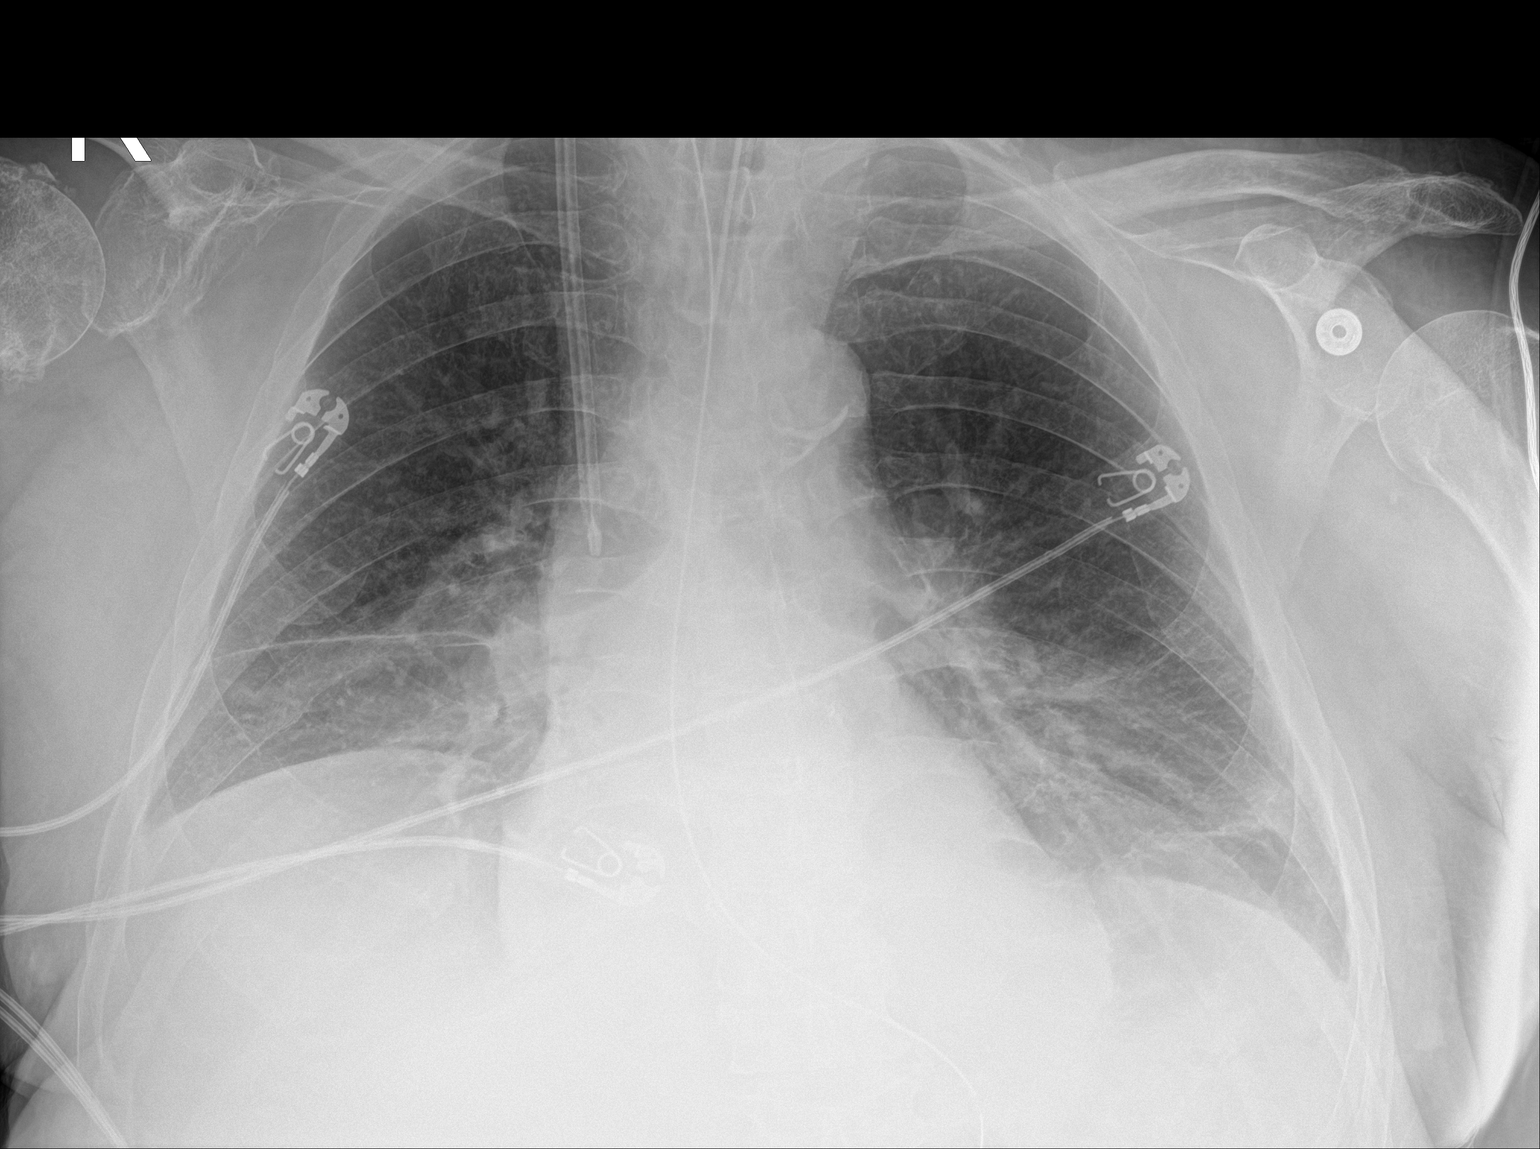

[1 of 1 positions shown; findings below may reference images not displayed]

FINDINGS: ET tube terminates in the mid trachea. Enteric tube courses inferior
to the diaphragm. Monitoring leads overlie the patient. Right IJ
sheath projects over the superior vena cava. Stable cardiac and
mediastinal contours. Aortic atherosclerosis. Low lung volumes.
Similar bilateral mid lower lung heterogeneous opacities. No pleural
effusion or pneumothorax.
IMPRESSION: Stable support apparatus.

Low lung volumes with bibasilar opacities which may represent
atelectasis or infection.
